# Patient Record
Sex: Female | Born: 1950
Health system: Southern US, Community
[De-identification: ages and names within clinical notes are randomized; demographics above are authoritative.]

## PROBLEM LIST (undated history)

## (undated) DIAGNOSIS — E785 Hyperlipidemia, unspecified: Secondary | ICD-10-CM

## (undated) DIAGNOSIS — Z8249 Family history of ischemic heart disease and other diseases of the circulatory system: Secondary | ICD-10-CM

## (undated) DIAGNOSIS — I10 Essential (primary) hypertension: Secondary | ICD-10-CM

## (undated) HISTORY — DX: Family history of ischemic heart disease and other diseases of the circulatory system: Z82.49

## (undated) HISTORY — DX: Hyperlipidemia, unspecified: E78.5

---

## 2008-12-06 ENCOUNTER — Ambulatory Visit (HOSPITAL_COMMUNITY): Admission: RE | Admit: 2008-12-06 | Discharge: 2008-12-06 | Payer: Self-pay | Admitting: Family Medicine

## 2009-12-08 ENCOUNTER — Ambulatory Visit (HOSPITAL_COMMUNITY): Admission: RE | Admit: 2009-12-08 | Discharge: 2009-12-08 | Payer: Self-pay | Admitting: Family Medicine

## 2010-11-06 ENCOUNTER — Other Ambulatory Visit (HOSPITAL_COMMUNITY): Payer: Self-pay | Admitting: Family Medicine

## 2010-11-06 DIAGNOSIS — Z139 Encounter for screening, unspecified: Secondary | ICD-10-CM

## 2010-12-11 ENCOUNTER — Ambulatory Visit (HOSPITAL_COMMUNITY)
Admission: RE | Admit: 2010-12-11 | Discharge: 2010-12-11 | Disposition: A | Discharge status: Home / Self Care | Payer: BC Managed Care – PPO | Source: Ambulatory Visit | Attending: Family Medicine | Admitting: Family Medicine

## 2010-12-11 DIAGNOSIS — Z139 Encounter for screening, unspecified: Secondary | ICD-10-CM

## 2010-12-11 DIAGNOSIS — Z1231 Encounter for screening mammogram for malignant neoplasm of breast: Secondary | ICD-10-CM | POA: Insufficient documentation

## 2011-01-07 ENCOUNTER — Other Ambulatory Visit (HOSPITAL_COMMUNITY): Payer: Self-pay | Admitting: Family Medicine

## 2011-01-07 DIAGNOSIS — Z139 Encounter for screening, unspecified: Secondary | ICD-10-CM

## 2011-01-07 DIAGNOSIS — Z01419 Encounter for gynecological examination (general) (routine) without abnormal findings: Secondary | ICD-10-CM

## 2011-01-11 ENCOUNTER — Ambulatory Visit (HOSPITAL_COMMUNITY)
Admission: RE | Admit: 2011-01-11 | Discharge: 2011-01-11 | Disposition: A | Discharge status: Home / Self Care | Payer: BLUE CROSS/BLUE SHIELD | Source: Ambulatory Visit | Attending: Family Medicine | Admitting: Family Medicine

## 2011-01-11 ENCOUNTER — Encounter (HOSPITAL_COMMUNITY): Payer: Self-pay

## 2011-01-11 DIAGNOSIS — Z139 Encounter for screening, unspecified: Secondary | ICD-10-CM

## 2011-01-11 DIAGNOSIS — Z01419 Encounter for gynecological examination (general) (routine) without abnormal findings: Secondary | ICD-10-CM

## 2011-01-11 DIAGNOSIS — Z13820 Encounter for screening for osteoporosis: Secondary | ICD-10-CM | POA: Insufficient documentation

## 2011-01-11 DIAGNOSIS — M949 Disorder of cartilage, unspecified: Secondary | ICD-10-CM | POA: Insufficient documentation

## 2011-01-11 DIAGNOSIS — M899 Disorder of bone, unspecified: Secondary | ICD-10-CM | POA: Insufficient documentation

## 2011-01-11 HISTORY — DX: Essential (primary) hypertension: I10

## 2011-11-19 ENCOUNTER — Other Ambulatory Visit (HOSPITAL_COMMUNITY): Payer: Self-pay | Admitting: Family Medicine

## 2011-11-19 DIAGNOSIS — Z139 Encounter for screening, unspecified: Secondary | ICD-10-CM

## 2011-12-12 ENCOUNTER — Ambulatory Visit (HOSPITAL_COMMUNITY)
Admission: RE | Admit: 2011-12-12 | Discharge: 2011-12-12 | Disposition: A | Discharge status: Home / Self Care | Payer: BC Managed Care – PPO | Source: Ambulatory Visit | Attending: Family Medicine | Admitting: Family Medicine

## 2011-12-12 DIAGNOSIS — Z139 Encounter for screening, unspecified: Secondary | ICD-10-CM

## 2011-12-12 DIAGNOSIS — Z1231 Encounter for screening mammogram for malignant neoplasm of breast: Secondary | ICD-10-CM | POA: Insufficient documentation

## 2012-11-04 ENCOUNTER — Other Ambulatory Visit (HOSPITAL_COMMUNITY): Payer: Self-pay | Admitting: Family Medicine

## 2012-11-04 ENCOUNTER — Other Ambulatory Visit: Payer: Self-pay

## 2012-11-04 DIAGNOSIS — Z139 Encounter for screening, unspecified: Secondary | ICD-10-CM

## 2012-12-28 ENCOUNTER — Ambulatory Visit (HOSPITAL_COMMUNITY): Payer: BC Managed Care – PPO

## 2013-01-14 ENCOUNTER — Ambulatory Visit: Payer: BC Managed Care – PPO | Attending: Orthopedic Surgery | Admitting: Physical Therapy

## 2013-01-14 DIAGNOSIS — IMO0001 Reserved for inherently not codable concepts without codable children: Secondary | ICD-10-CM | POA: Insufficient documentation

## 2013-01-14 DIAGNOSIS — R5381 Other malaise: Secondary | ICD-10-CM | POA: Insufficient documentation

## 2013-01-14 DIAGNOSIS — M25619 Stiffness of unspecified shoulder, not elsewhere classified: Secondary | ICD-10-CM | POA: Insufficient documentation

## 2013-01-14 DIAGNOSIS — S42309D Unspecified fracture of shaft of humerus, unspecified arm, subsequent encounter for fracture with routine healing: Secondary | ICD-10-CM | POA: Insufficient documentation

## 2013-01-14 DIAGNOSIS — M25519 Pain in unspecified shoulder: Secondary | ICD-10-CM | POA: Insufficient documentation

## 2013-01-19 ENCOUNTER — Ambulatory Visit: Payer: BC Managed Care – PPO | Attending: Orthopedic Surgery | Admitting: Physical Therapy

## 2013-01-19 DIAGNOSIS — M25619 Stiffness of unspecified shoulder, not elsewhere classified: Secondary | ICD-10-CM | POA: Insufficient documentation

## 2013-01-19 DIAGNOSIS — IMO0001 Reserved for inherently not codable concepts without codable children: Secondary | ICD-10-CM | POA: Insufficient documentation

## 2013-01-19 DIAGNOSIS — R5381 Other malaise: Secondary | ICD-10-CM | POA: Insufficient documentation

## 2013-01-19 DIAGNOSIS — M25519 Pain in unspecified shoulder: Secondary | ICD-10-CM | POA: Insufficient documentation

## 2013-01-19 DIAGNOSIS — S42309D Unspecified fracture of shaft of humerus, unspecified arm, subsequent encounter for fracture with routine healing: Secondary | ICD-10-CM | POA: Insufficient documentation

## 2013-01-21 ENCOUNTER — Ambulatory Visit: Payer: BC Managed Care – PPO | Admitting: Physical Therapy

## 2013-01-26 ENCOUNTER — Ambulatory Visit: Payer: BC Managed Care – PPO | Admitting: Physical Therapy

## 2013-01-28 ENCOUNTER — Ambulatory Visit: Payer: BC Managed Care – PPO | Admitting: Physical Therapy

## 2013-02-02 ENCOUNTER — Ambulatory Visit: Payer: BC Managed Care – PPO | Admitting: *Deleted

## 2013-02-04 ENCOUNTER — Ambulatory Visit: Payer: BC Managed Care – PPO | Admitting: Physical Therapy

## 2013-02-09 ENCOUNTER — Ambulatory Visit: Payer: BC Managed Care – PPO | Admitting: Physical Therapy

## 2013-02-11 ENCOUNTER — Ambulatory Visit: Payer: BC Managed Care – PPO | Admitting: Physical Therapy

## 2013-02-12 ENCOUNTER — Ambulatory Visit (HOSPITAL_COMMUNITY)
Admission: RE | Admit: 2013-02-12 | Discharge: 2013-02-12 | Disposition: A | Discharge status: Home / Self Care | Payer: BC Managed Care – PPO | Source: Ambulatory Visit | Attending: Family Medicine | Admitting: Family Medicine

## 2013-02-12 DIAGNOSIS — Z1231 Encounter for screening mammogram for malignant neoplasm of breast: Secondary | ICD-10-CM | POA: Insufficient documentation

## 2013-02-12 DIAGNOSIS — Z139 Encounter for screening, unspecified: Secondary | ICD-10-CM

## 2013-02-16 ENCOUNTER — Ambulatory Visit: Payer: BC Managed Care – PPO | Admitting: Physical Therapy

## 2013-02-18 ENCOUNTER — Ambulatory Visit: Payer: BC Managed Care – PPO | Attending: Orthopedic Surgery

## 2013-02-18 DIAGNOSIS — S42309D Unspecified fracture of shaft of humerus, unspecified arm, subsequent encounter for fracture with routine healing: Secondary | ICD-10-CM | POA: Insufficient documentation

## 2013-02-18 DIAGNOSIS — IMO0001 Reserved for inherently not codable concepts without codable children: Secondary | ICD-10-CM | POA: Insufficient documentation

## 2013-02-18 DIAGNOSIS — M25619 Stiffness of unspecified shoulder, not elsewhere classified: Secondary | ICD-10-CM | POA: Insufficient documentation

## 2013-02-18 DIAGNOSIS — R5381 Other malaise: Secondary | ICD-10-CM | POA: Insufficient documentation

## 2013-02-18 DIAGNOSIS — M25519 Pain in unspecified shoulder: Secondary | ICD-10-CM | POA: Insufficient documentation

## 2013-02-23 ENCOUNTER — Ambulatory Visit: Payer: BC Managed Care – PPO | Admitting: Physical Therapy

## 2013-02-25 ENCOUNTER — Ambulatory Visit: Payer: BC Managed Care – PPO | Admitting: Physical Therapy

## 2013-03-02 ENCOUNTER — Encounter: Payer: BC Managed Care – PPO | Admitting: Physical Therapy

## 2013-08-20 ENCOUNTER — Encounter: Payer: Self-pay | Admitting: Cardiovascular Disease

## 2013-08-20 ENCOUNTER — Ambulatory Visit (INDEPENDENT_AMBULATORY_CARE_PROVIDER_SITE_OTHER): Payer: BC Managed Care – PPO | Admitting: Cardiovascular Disease

## 2013-08-20 VITALS — BP 148/86 | HR 61 | Ht 67.0 in | Wt 196.3 lb

## 2013-08-20 DIAGNOSIS — E785 Hyperlipidemia, unspecified: Secondary | ICD-10-CM

## 2013-08-20 DIAGNOSIS — Z8249 Family history of ischemic heart disease and other diseases of the circulatory system: Secondary | ICD-10-CM

## 2013-08-20 DIAGNOSIS — I10 Essential (primary) hypertension: Secondary | ICD-10-CM | POA: Insufficient documentation

## 2013-08-20 NOTE — Assessment & Plan Note (Signed)
On Pravachol and WelChol followed by his private care physician

## 2013-08-20 NOTE — Assessment & Plan Note (Signed)
Well-controlled on current medications 

## 2013-08-20 NOTE — Patient Instructions (Signed)
Your physician recommends that you schedule a follow-up appointment in: 1 year  

## 2013-08-20 NOTE — Progress Notes (Signed)
08/20/2013 Olivia Marshall   1950-08-11  [id number]  Primary Physician Olivia Ribas, MD Primary Cardiologist: Olivia Gess MD Olivia Marshall   HPI:  [name] is a delightful 63 year old mildly overweight widowed Caucasian female mother of 3 children, grandmother 5 grandchildren who is the daughter of Olivia Marshall, one of my patients. She was referred by Dr. Dorthey Marshall for cardiovascular evaluation because of risk factors. Factors include family history with a mother who had CAD and is still living. She has treated hypertension and hyperlipidemia. She has never had a heart attack or stroke. She denies chest pain or shortness of breath.   Current Outpatient Prescriptions  Medication Sig Dispense Refill  . aspirin EC 81 MG tablet Take 81 mg by mouth daily.      . benazepril-hydrochlorthiazide (LOTENSIN HCT) 10-12.5 MG per tablet Take 1 tablet by mouth daily.      . calcium carbonate (OS-CAL) 600 MG TABS tablet Take 600 mg by mouth 2 (two) times daily with a meal.      . Coenzyme Q10 200 MG capsule Take 200 mg by mouth daily.      . colesevelam (WELCHOL) 625 MG tablet Take 625 mg by mouth daily.      . folic acid (FOLVITE) 400 MCG tablet Take 400 mcg by mouth daily.      . Omega-3 Fatty Acids (FISH OIL) 1200 MG CAPS Take 4 capsules by mouth daily.      . pravastatin (PRAVACHOL) 10 MG tablet Take 10 mg by mouth daily.      . Vitamin Mixture (ESTER-C PO) Take 500 mg by mouth daily.      Marland Kitchen zolpidem (AMBIEN) 10 MG tablet Take 10 mg by mouth as needed.       No current facility-administered medications for this visit.    No Known Allergies  History   Social History  . Marital Status: Widowed    Spouse Name: N/A    Number of Children: N/A  . Years of Education: N/A   Occupational History  . Not on file.   Social History Main Topics  . Smoking status: Never Smoker   . Smokeless tobacco: Never Used  . Alcohol Use: No  . Drug Use: No  . Sexual Activity: Not on  file   Other Topics Concern  . Not on file   Social History Narrative  . No narrative on file     Review of Systems: General: negative for chills, fever, night sweats or weight changes.  Cardiovascular: negative for chest pain, dyspnea on exertion, edema, orthopnea, palpitations, paroxysmal nocturnal dyspnea or shortness of breath Dermatological: negative for rash Respiratory: negative for cough or wheezing Urologic: negative for hematuria Abdominal: negative for nausea, vomiting, diarrhea, bright red blood per rectum, melena, or hematemesis Neurologic: negative for visual changes, syncope, or dizziness All other systems reviewed and are otherwise negative except as noted above.    Blood pressure 148/86, pulse 61, height 5\' 7"  (1.702 m), weight 196 lb 4.8 oz (89.041 kg).  General appearance: alert and no distress Neck: no adenopathy, no carotid bruit, no JVD, supple, symmetrical, trachea midline and thyroid not enlarged, symmetric, no tenderness/mass/nodules Lungs: clear to auscultation bilaterally Heart: regular rate and rhythm, S1, S2 normal, no murmur, click, rub or gallop Extremities: extremities normal, atraumatic, no cyanosis or edema and 2+ pedal pulses  EKG normal sinus rhythm at 61 with septal Q waves  ASSESSMENT AND PLAN:   Essential hypertension Well-controlled on current  medications  Hyperlipidemia On Pravachol and WelChol followed by his private care physician      Olivia GessJonathan J. Rosalita Carey MD [facility] LadysmithFACP,FACC,FAHA, [facility]SC[facility] 08/20/2013 8:31 AM

## 2014-01-06 ENCOUNTER — Other Ambulatory Visit (HOSPITAL_COMMUNITY): Payer: Self-pay | Admitting: Family Medicine

## 2014-01-06 DIAGNOSIS — Z1231 Encounter for screening mammogram for malignant neoplasm of breast: Secondary | ICD-10-CM

## 2014-03-02 ENCOUNTER — Ambulatory Visit (HOSPITAL_COMMUNITY)
Admission: RE | Admit: 2014-03-02 | Discharge: 2014-03-02 | Disposition: A | Discharge status: Home / Self Care | Payer: BC Managed Care – PPO | Source: Ambulatory Visit | Attending: Family Medicine | Admitting: Family Medicine

## 2014-03-02 DIAGNOSIS — Z1231 Encounter for screening mammogram for malignant neoplasm of breast: Secondary | ICD-10-CM

## 2015-01-24 ENCOUNTER — Encounter: Payer: Self-pay | Admitting: Cardiovascular Disease

## 2015-01-24 ENCOUNTER — Ambulatory Visit (INDEPENDENT_AMBULATORY_CARE_PROVIDER_SITE_OTHER): Payer: BLUE CROSS/BLUE SHIELD | Admitting: Cardiovascular Disease

## 2015-01-24 VITALS — BP 146/82 | HR 55 | Ht 66.0 in | Wt 213.0 lb

## 2015-01-24 DIAGNOSIS — I10 Essential (primary) hypertension: Secondary | ICD-10-CM

## 2015-01-24 DIAGNOSIS — E785 Hyperlipidemia, unspecified: Secondary | ICD-10-CM

## 2015-01-24 NOTE — Progress Notes (Signed)
01/24/2015 Olivia Marshall   [birth year]  [id number]  Primary Physician Olivia Ribas, MD Primary Cardiologist: Olivia Gess MD Olivia Marshall   HPI:  Olivia Marshall is a delightful 64 year old mildly overweight widowed Caucasian female mother of 3 children, grandmother 5 grandchildren who is the daughter of Olivia Marshall, one of my patients. I last saw her in the office 08/20/13. She was referred by Dr. Dorthey Marshall for cardiovascular evaluation because of risk factors. Her cardiovascular risk factors include family history with a mother who had CAD and passed away since her last visit.Marland Kitchen 2 of her brothers have coronary artery disease as well including her brother Olivia Marshall who had a large heart attack Thanksgiving 2015. She has treated hypertension and hyperlipidemia. She has never had a heart attack or stroke. She denies chest pain or shortness of breath.   Current Outpatient Prescriptions  Medication Sig Dispense Refill  . aspirin EC 81 MG tablet Take 81 mg by mouth daily.    . benazepril-hydrochlorthiazide (LOTENSIN HCT) 10-12.5 MG per tablet Take 1 tablet by mouth daily.    . calcium carbonate (OS-CAL) 600 MG TABS tablet Take 600 mg by mouth 2 (two) times daily with a meal.    . Coenzyme Q10 200 MG capsule Take 200 mg by mouth daily.    . folic acid (FOLVITE) 400 MCG tablet Take 400 mcg by mouth daily.    . Omega-3 Fatty Acids (FISH OIL) 1200 MG CAPS Take 4 capsules by mouth daily.    . pravastatin (PRAVACHOL) 10 MG tablet Take 10 mg by mouth daily.    . Vitamin Mixture (ESTER-C PO) Take 500 mg by mouth daily.     No current facility-administered medications for this visit.    No Known Allergies  Social History   Social History  . Marital Status: Widowed    Spouse Name: N/A  . Number of Children: N/A  . Years of Education: N/A   Occupational History  . Not on file.   Social History Main Topics  . Smoking status: Never Smoker   . Smokeless tobacco: Never Used  .  Alcohol Use: No  . Drug Use: No  . Sexual Activity: Not on file   Other Topics Concern  . Not on file   Social History Narrative     Review of Systems: General: negative for chills, fever, night sweats or weight changes.  Cardiovascular: negative for chest pain, dyspnea on exertion, edema, orthopnea, palpitations, paroxysmal nocturnal dyspnea or shortness of breath Dermatological: negative for rash Respiratory: negative for cough or wheezing Urologic: negative for hematuria Abdominal: negative for nausea, vomiting, diarrhea, bright red blood per rectum, melena, or hematemesis Neurologic: negative for visual changes, syncope, or dizziness All other systems reviewed and are otherwise negative except as noted above.    Blood pressure 146/82, pulse 55, height  (1.676 m), weight 213 lb (96.616 kg).  General appearance: alert and no distress Neck: no adenopathy, no carotid bruit, no JVD, supple, symmetrical, trachea midline and thyroid not enlarged, symmetric, no tenderness/mass/nodules Lungs: clear to auscultation bilaterally Heart: regular rate and rhythm, S1, S2 normal, no murmur, click, rub or gallop Extremities: extremities normal, atraumatic, no cyanosis or edema Pulses: 2+ and symmetric Skin: Skin color, texture, turgor normal. No rashes or lesions Neurologic: Grossly normal  EKG sinus bradycardia 55 without ST or T-wave changes. I personally reviewed this EKG  ASSESSMENT AND PLAN:   Hyperlipidemia Olivia Marshall has a history of hyperlipidemia on pravastatin 10  mg a day followed by her PCP.  Essential hypertension History of hypertension with blood pressure measured today at 146/82. She is on Lotensin and hydrochlorothiazide. Continue current meds at current dosing      Olivia Gess MD [facility], [facility] 01/24/2015 8:32 AM

## 2015-01-24 NOTE — Assessment & Plan Note (Signed)
Olivia Marshall has a history of hyperlipidemia on pravastatin 10 mg a day followed by her PCP.

## 2015-01-24 NOTE — Assessment & Plan Note (Signed)
History of hypertension with blood pressure measured today at 146/82. She is on Lotensin and hydrochlorothiazide. Continue current meds at current dosing

## 2015-01-24 NOTE — Patient Instructions (Signed)
Your physician wants you to follow-up in: 1 year with Dr Berry. You will receive a reminder letter in the mail two months in advance. If you don't receive a letter, please call our office to schedule the follow-up appointment.  

## 2015-02-02 ENCOUNTER — Other Ambulatory Visit (HOSPITAL_COMMUNITY): Payer: Self-pay | Admitting: Family Medicine

## 2015-02-02 DIAGNOSIS — Z1231 Encounter for screening mammogram for malignant neoplasm of breast: Secondary | ICD-10-CM

## 2015-03-16 ENCOUNTER — Ambulatory Visit (HOSPITAL_COMMUNITY)
Admission: RE | Admit: 2015-03-16 | Discharge: 2015-03-16 | Disposition: A | Discharge status: Home / Self Care | Payer: BLUE CROSS/BLUE SHIELD | Source: Ambulatory Visit | Attending: Family Medicine | Admitting: Family Medicine

## 2015-03-16 DIAGNOSIS — Z1231 Encounter for screening mammogram for malignant neoplasm of breast: Secondary | ICD-10-CM | POA: Insufficient documentation

## 2015-07-24 ENCOUNTER — Other Ambulatory Visit (HOSPITAL_COMMUNITY): Payer: Self-pay | Admitting: Family Medicine

## 2015-07-24 ENCOUNTER — Ambulatory Visit (HOSPITAL_COMMUNITY)
Admission: RE | Admit: 2015-07-24 | Discharge: 2015-07-24 | Disposition: A | Discharge status: Home / Self Care | Payer: BLUE CROSS/BLUE SHIELD | Source: Ambulatory Visit | Attending: Family Medicine | Admitting: Family Medicine

## 2015-07-24 DIAGNOSIS — E119 Type 2 diabetes mellitus without complications: Secondary | ICD-10-CM | POA: Insufficient documentation

## 2015-07-24 DIAGNOSIS — E6609 Other obesity due to excess calories: Secondary | ICD-10-CM | POA: Diagnosis present

## 2015-07-24 DIAGNOSIS — M79601 Pain in right arm: Secondary | ICD-10-CM

## 2015-07-24 DIAGNOSIS — M79621 Pain in right upper arm: Secondary | ICD-10-CM | POA: Insufficient documentation

## 2016-01-30 ENCOUNTER — Encounter: Payer: Self-pay | Admitting: Cardiovascular Disease

## 2016-01-30 ENCOUNTER — Ambulatory Visit (INDEPENDENT_AMBULATORY_CARE_PROVIDER_SITE_OTHER): Payer: PPO | Admitting: Cardiovascular Disease

## 2016-01-30 VITALS — BP 152/84 | HR 76 | Ht 67.0 in | Wt 219.1 lb

## 2016-01-30 DIAGNOSIS — E785 Hyperlipidemia, unspecified: Secondary | ICD-10-CM | POA: Diagnosis not present

## 2016-01-30 DIAGNOSIS — I10 Essential (primary) hypertension: Secondary | ICD-10-CM

## 2016-01-30 NOTE — Progress Notes (Signed)
01/30/2016 Olivia Marshall   25-Jan-1951  [id number]  Primary Physician Colette RibasGOLDING, JOHN CABOT, MD Primary Cardiologist: Runell GessJonathan J Lianette Broussard MD Roseanne RenoFACP, FACC, FAHA, FSCAI  HPI:  Ms. Olivia Marshall is a delightful 65 year old mildly overweight widowed Caucasian female mother of 3 children, grandmother 5 grandchildren who is the daughter of Olivia Marshall, one of my patients. I last saw her in the office 01/24/15. She was referred by Dr. Dorthey SawyerJay Golding for cardiovascular evaluation because of risk factors. Her cardiovascular risk factors include family history with a mother who had CAD and passed away since her last visit.Marland Kitchen. 2 of her brothers have coronary artery disease as well including her brother Olivia Marshall who had a large heart attack Thanksgiving 2015. She has treated hypertension and hyperlipidemia. She has never had a heart attack or stroke. She denies chest pain or shortness of breath.   Current Outpatient Prescriptions  Medication Sig Dispense Refill  . aspirin EC 81 MG tablet Take 81 mg by mouth daily.    . benazepril-hydrochlorthiazide (LOTENSIN HCT) 10-12.5 MG per tablet Take 1 tablet by mouth daily.    . calcium carbonate (OS-CAL) 600 MG TABS tablet Take 600 mg by mouth 2 (two) times daily with a meal.    . Coenzyme Q10 200 MG capsule Take 200 mg by mouth daily.    . folic acid (FOLVITE) 400 MCG tablet Take 400 mcg by mouth daily.    . Omega-3 Fatty Acids (FISH OIL) 1200 MG CAPS Take 4 capsules by mouth daily.    . pravastatin (PRAVACHOL) 10 MG tablet Take 10 mg by mouth daily.    . Vitamin Mixture (ESTER-C PO) Take 500 mg by mouth daily.     No current facility-administered medications for this visit.     No Known Allergies  Social History   Social History  . Marital status: Widowed    Spouse name: N/A  . Number of children: N/A  . Years of education: N/A   Occupational History  . Not on file.   Social History Main Topics  . Smoking status: Never Smoker  . Smokeless tobacco: Never Used  .  Alcohol use No  . Drug use: No  . Sexual activity: Not on file   Other Topics Concern  . Not on file   Social History Narrative  . No narrative on file     Review of Systems: General: negative for chills, fever, night sweats or weight changes.  Cardiovascular: negative for chest pain, dyspnea on exertion, edema, orthopnea, palpitations, paroxysmal nocturnal dyspnea or shortness of breath Dermatological: negative for rash Respiratory: negative for cough or wheezing Urologic: negative for hematuria Abdominal: negative for nausea, vomiting, diarrhea, bright red blood per rectum, melena, or hematemesis Neurologic: negative for visual changes, syncope, or dizziness All other systems reviewed and are otherwise negative except as noted above.    Blood pressure (!) 152/84, pulse 76, height 5\' 7"  (1.702 m), weight 219 lb 2 oz (99.4 kg).  General appearance: alert and no distress Neck: no adenopathy, no carotid bruit, no JVD, supple, symmetrical, trachea midline and thyroid not enlarged, symmetric, no tenderness/mass/nodules Lungs: clear to auscultation bilaterally Heart: regular rate and rhythm, S1, S2 normal, no murmur, click, rub or gallop Extremities: extremities normal, atraumatic, no cyanosis or edema  EKG normal sinus rhythm at 76 with left bundle-branch block which it is a new finding for her. I personally reviewed this EKG  ASSESSMENT AND PLAN:   Essential hypertension History of hypertension blood pressure measured at  152/84. She is on benazepril and hydrochlorothiazide. Continue current meds at current dosing  Hyperlipidemia History of hyperlipidemia on statin therapy followed by her PCP      Runell Gess MD [facility], [facility] 01/30/2016 10:23 AM

## 2016-01-30 NOTE — Assessment & Plan Note (Signed)
History of hypertension blood pressure measured at 152/84. She is on benazepril and hydrochlorothiazide. Continue current meds at current dosing

## 2016-01-30 NOTE — Patient Instructions (Signed)

## 2016-01-30 NOTE — Assessment & Plan Note (Signed)
History of hyperlipidemia on statin therapy followed by her PCP. 

## 2016-02-21 ENCOUNTER — Other Ambulatory Visit (HOSPITAL_COMMUNITY): Payer: Self-pay | Admitting: Family Medicine

## 2016-02-21 DIAGNOSIS — Z1231 Encounter for screening mammogram for malignant neoplasm of breast: Secondary | ICD-10-CM

## 2016-03-18 ENCOUNTER — Ambulatory Visit (HOSPITAL_COMMUNITY)
Admission: RE | Admit: 2016-03-18 | Discharge: 2016-03-18 | Disposition: A | Discharge status: Home / Self Care | Payer: PPO | Source: Ambulatory Visit | Attending: Family Medicine | Admitting: Family Medicine

## 2016-03-18 DIAGNOSIS — Z1231 Encounter for screening mammogram for malignant neoplasm of breast: Secondary | ICD-10-CM | POA: Diagnosis not present

## 2016-08-05 DIAGNOSIS — E782 Mixed hyperlipidemia: Secondary | ICD-10-CM | POA: Diagnosis not present

## 2016-08-05 DIAGNOSIS — I10 Essential (primary) hypertension: Secondary | ICD-10-CM | POA: Diagnosis not present

## 2016-08-05 DIAGNOSIS — E6609 Other obesity due to excess calories: Secondary | ICD-10-CM | POA: Diagnosis not present

## 2016-08-05 DIAGNOSIS — Z6836 Body mass index (BMI) 36.0-36.9, adult: Secondary | ICD-10-CM | POA: Diagnosis not present

## 2016-08-05 DIAGNOSIS — J069 Acute upper respiratory infection, unspecified: Secondary | ICD-10-CM | POA: Diagnosis not present

## 2016-08-05 DIAGNOSIS — R7309 Other abnormal glucose: Secondary | ICD-10-CM | POA: Diagnosis not present

## 2016-08-05 DIAGNOSIS — Z1389 Encounter for screening for other disorder: Secondary | ICD-10-CM | POA: Diagnosis not present

## 2016-11-18 ENCOUNTER — Other Ambulatory Visit (HOSPITAL_COMMUNITY): Payer: Self-pay | Admitting: Family Medicine

## 2016-11-18 DIAGNOSIS — E2839 Other primary ovarian failure: Secondary | ICD-10-CM

## 2016-11-25 DIAGNOSIS — L255 Unspecified contact dermatitis due to plants, except food: Secondary | ICD-10-CM | POA: Diagnosis not present

## 2016-12-18 ENCOUNTER — Ambulatory Visit (HOSPITAL_COMMUNITY)
Admission: RE | Admit: 2016-12-18 | Discharge: 2016-12-18 | Disposition: A | Discharge status: Home / Self Care | Payer: Medicare HMO | Source: Ambulatory Visit | Attending: Family Medicine | Admitting: Family Medicine

## 2016-12-18 DIAGNOSIS — E2839 Other primary ovarian failure: Secondary | ICD-10-CM | POA: Diagnosis not present

## 2016-12-18 DIAGNOSIS — Z13820 Encounter for screening for osteoporosis: Secondary | ICD-10-CM | POA: Diagnosis not present

## 2016-12-18 DIAGNOSIS — Z780 Asymptomatic menopausal state: Secondary | ICD-10-CM | POA: Diagnosis not present

## 2017-02-11 DIAGNOSIS — H5211 Myopia, right eye: Secondary | ICD-10-CM | POA: Diagnosis not present

## 2017-02-11 DIAGNOSIS — E782 Mixed hyperlipidemia: Secondary | ICD-10-CM | POA: Diagnosis not present

## 2017-02-11 DIAGNOSIS — I10 Essential (primary) hypertension: Secondary | ICD-10-CM | POA: Diagnosis not present

## 2017-02-11 DIAGNOSIS — Z23 Encounter for immunization: Secondary | ICD-10-CM | POA: Diagnosis not present

## 2017-02-11 DIAGNOSIS — H524 Presbyopia: Secondary | ICD-10-CM | POA: Diagnosis not present

## 2017-02-11 DIAGNOSIS — Z0001 Encounter for general adult medical examination with abnormal findings: Secondary | ICD-10-CM | POA: Diagnosis not present

## 2017-02-11 DIAGNOSIS — H5202 Hypermetropia, left eye: Secondary | ICD-10-CM | POA: Diagnosis not present

## 2017-02-11 DIAGNOSIS — Z6834 Body mass index (BMI) 34.0-34.9, adult: Secondary | ICD-10-CM | POA: Diagnosis not present

## 2017-02-11 DIAGNOSIS — H52202 Unspecified astigmatism, left eye: Secondary | ICD-10-CM | POA: Diagnosis not present

## 2017-02-11 DIAGNOSIS — Z1389 Encounter for screening for other disorder: Secondary | ICD-10-CM | POA: Diagnosis not present

## 2017-02-11 DIAGNOSIS — R7309 Other abnormal glucose: Secondary | ICD-10-CM | POA: Diagnosis not present

## 2017-02-11 DIAGNOSIS — E6609 Other obesity due to excess calories: Secondary | ICD-10-CM | POA: Diagnosis not present

## 2017-03-19 ENCOUNTER — Other Ambulatory Visit (HOSPITAL_COMMUNITY): Payer: Self-pay | Admitting: Family Medicine

## 2017-03-19 DIAGNOSIS — Z1231 Encounter for screening mammogram for malignant neoplasm of breast: Secondary | ICD-10-CM

## 2017-03-27 ENCOUNTER — Ambulatory Visit (HOSPITAL_COMMUNITY)
Admission: RE | Admit: 2017-03-27 | Discharge: 2017-03-27 | Disposition: A | Discharge status: Home / Self Care | Payer: Medicare HMO | Source: Ambulatory Visit | Attending: Family Medicine | Admitting: Family Medicine

## 2017-03-27 DIAGNOSIS — Z1231 Encounter for screening mammogram for malignant neoplasm of breast: Secondary | ICD-10-CM | POA: Insufficient documentation

## 2017-06-03 DIAGNOSIS — E6609 Other obesity due to excess calories: Secondary | ICD-10-CM | POA: Diagnosis not present

## 2017-06-03 DIAGNOSIS — Z1389 Encounter for screening for other disorder: Secondary | ICD-10-CM | POA: Diagnosis not present

## 2017-06-03 DIAGNOSIS — R8290 Unspecified abnormal findings in urine: Secondary | ICD-10-CM | POA: Diagnosis not present

## 2017-06-03 DIAGNOSIS — I10 Essential (primary) hypertension: Secondary | ICD-10-CM | POA: Diagnosis not present

## 2017-06-03 DIAGNOSIS — R946 Abnormal results of thyroid function studies: Secondary | ICD-10-CM | POA: Diagnosis not present

## 2017-06-03 DIAGNOSIS — R7309 Other abnormal glucose: Secondary | ICD-10-CM | POA: Diagnosis not present

## 2017-06-03 DIAGNOSIS — Z6834 Body mass index (BMI) 34.0-34.9, adult: Secondary | ICD-10-CM | POA: Diagnosis not present

## 2017-06-03 DIAGNOSIS — E782 Mixed hyperlipidemia: Secondary | ICD-10-CM | POA: Diagnosis not present

## 2017-10-20 DIAGNOSIS — J069 Acute upper respiratory infection, unspecified: Secondary | ICD-10-CM | POA: Diagnosis not present

## 2017-10-20 DIAGNOSIS — J019 Acute sinusitis, unspecified: Secondary | ICD-10-CM | POA: Diagnosis not present

## 2017-12-10 DIAGNOSIS — Z6835 Body mass index (BMI) 35.0-35.9, adult: Secondary | ICD-10-CM | POA: Diagnosis not present

## 2017-12-10 DIAGNOSIS — I10 Essential (primary) hypertension: Secondary | ICD-10-CM | POA: Diagnosis not present

## 2017-12-10 DIAGNOSIS — G4730 Sleep apnea, unspecified: Secondary | ICD-10-CM | POA: Diagnosis not present

## 2017-12-10 DIAGNOSIS — Z1389 Encounter for screening for other disorder: Secondary | ICD-10-CM | POA: Diagnosis not present

## 2017-12-10 DIAGNOSIS — E782 Mixed hyperlipidemia: Secondary | ICD-10-CM | POA: Diagnosis not present

## 2017-12-10 DIAGNOSIS — R946 Abnormal results of thyroid function studies: Secondary | ICD-10-CM | POA: Diagnosis not present

## 2017-12-10 DIAGNOSIS — R7309 Other abnormal glucose: Secondary | ICD-10-CM | POA: Diagnosis not present

## 2017-12-16 DIAGNOSIS — G4700 Insomnia, unspecified: Secondary | ICD-10-CM | POA: Diagnosis not present

## 2017-12-16 DIAGNOSIS — Z7722 Contact with and (suspected) exposure to environmental tobacco smoke (acute) (chronic): Secondary | ICD-10-CM | POA: Diagnosis not present

## 2017-12-16 DIAGNOSIS — K5900 Constipation, unspecified: Secondary | ICD-10-CM | POA: Diagnosis not present

## 2017-12-16 DIAGNOSIS — K08409 Partial loss of teeth, unspecified cause, unspecified class: Secondary | ICD-10-CM | POA: Diagnosis not present

## 2017-12-16 DIAGNOSIS — Z809 Family history of malignant neoplasm, unspecified: Secondary | ICD-10-CM | POA: Diagnosis not present

## 2017-12-16 DIAGNOSIS — Z800 Family history of malignant neoplasm of digestive organs: Secondary | ICD-10-CM | POA: Diagnosis not present

## 2017-12-16 DIAGNOSIS — Z820 Family history of epilepsy and other diseases of the nervous system: Secondary | ICD-10-CM | POA: Diagnosis not present

## 2017-12-16 DIAGNOSIS — E785 Hyperlipidemia, unspecified: Secondary | ICD-10-CM | POA: Diagnosis not present

## 2017-12-16 DIAGNOSIS — I10 Essential (primary) hypertension: Secondary | ICD-10-CM | POA: Diagnosis not present

## 2017-12-16 DIAGNOSIS — R32 Unspecified urinary incontinence: Secondary | ICD-10-CM | POA: Diagnosis not present

## 2018-03-09 ENCOUNTER — Other Ambulatory Visit (HOSPITAL_COMMUNITY): Payer: Self-pay | Admitting: Family Medicine

## 2018-03-09 DIAGNOSIS — Z1231 Encounter for screening mammogram for malignant neoplasm of breast: Secondary | ICD-10-CM

## 2018-03-30 ENCOUNTER — Encounter (HOSPITAL_COMMUNITY): Payer: Self-pay

## 2018-03-30 ENCOUNTER — Ambulatory Visit (HOSPITAL_COMMUNITY)
Admission: RE | Admit: 2018-03-30 | Discharge: 2018-03-30 | Disposition: A | Discharge status: Home / Self Care | Payer: Medicare HMO | Source: Ambulatory Visit | Attending: Family Medicine | Admitting: Family Medicine

## 2018-03-30 DIAGNOSIS — Z1231 Encounter for screening mammogram for malignant neoplasm of breast: Secondary | ICD-10-CM | POA: Insufficient documentation

## 2018-04-07 DIAGNOSIS — M7661 Achilles tendinitis, right leg: Secondary | ICD-10-CM | POA: Diagnosis not present

## 2018-04-07 DIAGNOSIS — R350 Frequency of micturition: Secondary | ICD-10-CM | POA: Diagnosis not present

## 2018-04-07 DIAGNOSIS — Z1389 Encounter for screening for other disorder: Secondary | ICD-10-CM | POA: Diagnosis not present

## 2018-04-07 DIAGNOSIS — Z6836 Body mass index (BMI) 36.0-36.9, adult: Secondary | ICD-10-CM | POA: Diagnosis not present

## 2018-04-07 DIAGNOSIS — Z23 Encounter for immunization: Secondary | ICD-10-CM | POA: Diagnosis not present

## 2018-04-07 DIAGNOSIS — E6609 Other obesity due to excess calories: Secondary | ICD-10-CM | POA: Diagnosis not present

## 2018-04-09 DIAGNOSIS — H52202 Unspecified astigmatism, left eye: Secondary | ICD-10-CM | POA: Diagnosis not present

## 2018-04-09 DIAGNOSIS — H5211 Myopia, right eye: Secondary | ICD-10-CM | POA: Diagnosis not present

## 2018-04-09 DIAGNOSIS — H524 Presbyopia: Secondary | ICD-10-CM | POA: Diagnosis not present

## 2018-04-09 DIAGNOSIS — H2513 Age-related nuclear cataract, bilateral: Secondary | ICD-10-CM | POA: Diagnosis not present

## 2018-04-09 DIAGNOSIS — H25013 Cortical age-related cataract, bilateral: Secondary | ICD-10-CM | POA: Diagnosis not present

## 2018-04-21 ENCOUNTER — Ambulatory Visit (INDEPENDENT_AMBULATORY_CARE_PROVIDER_SITE_OTHER): Payer: Medicare HMO | Admitting: Orthopedic Surgery

## 2018-04-21 ENCOUNTER — Ambulatory Visit (INDEPENDENT_AMBULATORY_CARE_PROVIDER_SITE_OTHER): Payer: Medicare HMO

## 2018-04-21 ENCOUNTER — Encounter (INDEPENDENT_AMBULATORY_CARE_PROVIDER_SITE_OTHER): Payer: Self-pay | Admitting: Orthopedic Surgery

## 2018-04-21 VITALS — Ht 67.0 in | Wt 219.0 lb

## 2018-04-21 DIAGNOSIS — M9261 Juvenile osteochondrosis of tarsus, right ankle: Secondary | ICD-10-CM

## 2018-04-21 DIAGNOSIS — M79671 Pain in right foot: Secondary | ICD-10-CM

## 2018-04-21 NOTE — Progress Notes (Signed)
Office Visit Note   Patient: Olivia Marshall           Date of Birth: 1951-04-06           MRN: [id number] Visit Date: 04/21/2018              Requested by: Assunta FoundGolding, John, MD [address] [place], [place] [id number] PCP: Assunta FoundGolding, John, MD  Chief Complaint  Patient presents with  . Right Foot - Pain      HPI: Patient is a 67 year old woman who presents with painful Haglund's deformity on the right.  Patient is status post Haglund's resection on the left years ago she did sustain trauma where she dropped a metal pipe against her surgical incision and sustained a Achilles tendon rupture.  Patient has been asymptomatic on the left only has symptoms on the right.  Assessment & Plan: Visit Diagnoses:  1. Right foot pain   2. Haglund's deformity of right heel     Plan: Wound recommended proceeding with Haglund's resection endoscopically risks and benefits were discussed including risk of the Achilles rupture risk of infection.  Patient states she understands wished to proceed as outpatient surgery after the first of the year.  We will need to place her in a fracture boot with a heel lift postoperatively.  Follow-Up Instructions: Return in about 1 week (around 04/28/2018).   Ortho Exam  Patient is alert, oriented, no adenopathy, well-dressed, normal affect, normal respiratory effort. Examination patient has good pulses she has dorsiflexion past neutral she has a very large Haglund's deformity posteriorly no skin breakdown or lesions.  She is tender to palpation over the Haglund's deformity has good active Achilles tendon function with no nodular changes.  Imaging: Xr Foot 2 Views Right  Result Date: 04/21/2018 Lateral radiograph of the right hindfoot shows a massive Haglund's deformity with enthesopathy and calcification of the Achilles insertion.  No images are attached to the encounter.  Labs: No results found for: HGBA1C, ESRSEDRATE, CRP, LABURIC, REPTSTATUS, GRAMSTAIN,  CULT, LABORGA   No results found for: ALBUMIN, PREALBUMIN, LABURIC  Body mass index is 34.3 kg/m.  Orders:  Orders Placed This Encounter  Procedures  . XR Foot 2 Views Right   No orders of the defined types were placed in this encounter.    Procedures: No procedures performed  Clinical Data: No additional findings.  ROS:  All other systems negative, except as noted in the HPI. Review of Systems  Objective: Vital Signs: Ht 5\' 7"  (1.702 m)   Wt 219 lb (99.3 kg)   BMI 34.30 kg/m   Specialty Comments:  No specialty comments available.  PMFS History: Patient Active Problem List   Diagnosis Date Noted  . Essential hypertension 08/20/2013  . Hyperlipidemia 08/20/2013  . Family history of coronary artery disease 08/20/2013   Past Medical History:  Diagnosis Date  . Family history of heart disease   . Hyperlipidemia   . Hypertension     Family History  Problem Relation Age of Onset  . Heart disease Mother   . Transient ischemic attack Mother   . Hyperlipidemia Mother   . Hypertension Mother   . ALS Father   . Cancer Brother        Colon cancer  . Hypertension Brother   . Cancer Maternal Grandmother        Colon cancer  . Hyperlipidemia Maternal Grandfather   . Tuberculosis Paternal Grandmother   . Stroke Paternal Grandfather   . Heart disease Paternal Grandfather  History reviewed. No pertinent surgical history. Social History   Occupational History  . Not on file  Tobacco Use  . Smoking status: Never Smoker  . Smokeless tobacco: Never Used  Substance and Sexual Activity  . Alcohol use: No  . Drug use: No  . Sexual activity: Not on file

## 2018-05-07 DIAGNOSIS — G9389 Other specified disorders of brain: Secondary | ICD-10-CM | POA: Diagnosis not present

## 2018-05-07 DIAGNOSIS — S80211A Abrasion, right knee, initial encounter: Secondary | ICD-10-CM | POA: Diagnosis not present

## 2018-05-07 DIAGNOSIS — S12201A Unspecified nondisplaced fracture of third cervical vertebra, initial encounter for closed fracture: Secondary | ICD-10-CM | POA: Diagnosis not present

## 2018-05-07 DIAGNOSIS — S02122A Fracture of orbital roof, left side, initial encounter for closed fracture: Secondary | ICD-10-CM | POA: Diagnosis not present

## 2018-05-07 DIAGNOSIS — S2241XA Multiple fractures of ribs, right side, initial encounter for closed fracture: Secondary | ICD-10-CM | POA: Diagnosis not present

## 2018-05-07 DIAGNOSIS — S02839A Fracture of medial orbital wall, unspecified side, initial encounter for closed fracture: Secondary | ICD-10-CM | POA: Diagnosis not present

## 2018-05-07 DIAGNOSIS — S065X9A Traumatic subdural hemorrhage with loss of consciousness of unspecified duration, initial encounter: Secondary | ICD-10-CM | POA: Diagnosis not present

## 2018-05-07 DIAGNOSIS — H538 Other visual disturbances: Secondary | ICD-10-CM | POA: Diagnosis not present

## 2018-05-07 DIAGNOSIS — S065X0A Traumatic subdural hemorrhage without loss of consciousness, initial encounter: Secondary | ICD-10-CM | POA: Diagnosis not present

## 2018-05-07 DIAGNOSIS — M25531 Pain in right wrist: Secondary | ICD-10-CM | POA: Diagnosis not present

## 2018-05-07 DIAGNOSIS — S066X9A Traumatic subarachnoid hemorrhage with loss of consciousness of unspecified duration, initial encounter: Secondary | ICD-10-CM | POA: Diagnosis not present

## 2018-05-07 DIAGNOSIS — S066X0A Traumatic subarachnoid hemorrhage without loss of consciousness, initial encounter: Secondary | ICD-10-CM | POA: Diagnosis not present

## 2018-05-07 DIAGNOSIS — R0989 Other specified symptoms and signs involving the circulatory and respiratory systems: Secondary | ICD-10-CM | POA: Diagnosis not present

## 2018-05-07 DIAGNOSIS — S01111A Laceration without foreign body of right eyelid and periocular area, initial encounter: Secondary | ICD-10-CM | POA: Diagnosis not present

## 2018-05-07 DIAGNOSIS — R52 Pain, unspecified: Secondary | ICD-10-CM | POA: Diagnosis not present

## 2018-05-07 DIAGNOSIS — S12290A Other displaced fracture of third cervical vertebra, initial encounter for closed fracture: Secondary | ICD-10-CM | POA: Diagnosis not present

## 2018-05-07 DIAGNOSIS — S12390A Other displaced fracture of fourth cervical vertebra, initial encounter for closed fracture: Secondary | ICD-10-CM | POA: Diagnosis not present

## 2018-05-07 DIAGNOSIS — I6502 Occlusion and stenosis of left vertebral artery: Secondary | ICD-10-CM | POA: Diagnosis not present

## 2018-05-07 DIAGNOSIS — S0240EA Zygomatic fracture, right side, initial encounter for closed fracture: Secondary | ICD-10-CM | POA: Diagnosis not present

## 2018-05-07 DIAGNOSIS — S12200A Unspecified displaced fracture of third cervical vertebra, initial encounter for closed fracture: Secondary | ICD-10-CM | POA: Diagnosis not present

## 2018-05-07 DIAGNOSIS — S0231XA Fracture of orbital floor, right side, initial encounter for closed fracture: Secondary | ICD-10-CM | POA: Diagnosis not present

## 2018-05-07 DIAGNOSIS — R58 Hemorrhage, not elsewhere classified: Secondary | ICD-10-CM | POA: Diagnosis not present

## 2018-05-07 DIAGNOSIS — S0219XA Other fracture of base of skull, initial encounter for closed fracture: Secondary | ICD-10-CM | POA: Diagnosis not present

## 2018-05-07 DIAGNOSIS — S02129A Fracture of orbital roof, unspecified side, initial encounter for closed fracture: Secondary | ICD-10-CM | POA: Diagnosis not present

## 2018-05-07 DIAGNOSIS — S0240CA Maxillary fracture, right side, initial encounter for closed fracture: Secondary | ICD-10-CM | POA: Diagnosis not present

## 2018-05-07 DIAGNOSIS — E785 Hyperlipidemia, unspecified: Secondary | ICD-10-CM | POA: Diagnosis not present

## 2018-05-07 DIAGNOSIS — Z041 Encounter for examination and observation following transport accident: Secondary | ICD-10-CM | POA: Diagnosis not present

## 2018-05-07 DIAGNOSIS — S022XXA Fracture of nasal bones, initial encounter for closed fracture: Secondary | ICD-10-CM | POA: Diagnosis not present

## 2018-05-07 DIAGNOSIS — Y998 Other external cause status: Secondary | ICD-10-CM | POA: Diagnosis not present

## 2018-05-07 DIAGNOSIS — H269 Unspecified cataract: Secondary | ICD-10-CM | POA: Diagnosis not present

## 2018-05-07 DIAGNOSIS — S02401A Maxillary fracture, unspecified, initial encounter for closed fracture: Secondary | ICD-10-CM | POA: Diagnosis not present

## 2018-05-07 DIAGNOSIS — S15102A Unspecified injury of left vertebral artery, initial encounter: Secondary | ICD-10-CM | POA: Diagnosis not present

## 2018-05-07 DIAGNOSIS — I7774 Dissection of vertebral artery: Secondary | ICD-10-CM | POA: Diagnosis not present

## 2018-05-07 DIAGNOSIS — R410 Disorientation, unspecified: Secondary | ICD-10-CM | POA: Diagnosis not present

## 2018-05-07 DIAGNOSIS — S02121A Fracture of orbital roof, right side, initial encounter for closed fracture: Secondary | ICD-10-CM | POA: Diagnosis not present

## 2018-05-07 DIAGNOSIS — Y92410 Unspecified street and highway as the place of occurrence of the external cause: Secondary | ICD-10-CM | POA: Diagnosis not present

## 2018-05-07 DIAGNOSIS — S0101XA Laceration without foreign body of scalp, initial encounter: Secondary | ICD-10-CM | POA: Diagnosis not present

## 2018-05-07 DIAGNOSIS — S0181XA Laceration without foreign body of other part of head, initial encounter: Secondary | ICD-10-CM | POA: Diagnosis not present

## 2018-05-07 DIAGNOSIS — I10 Essential (primary) hypertension: Secondary | ICD-10-CM | POA: Diagnosis not present

## 2018-05-07 DIAGNOSIS — R0902 Hypoxemia: Secondary | ICD-10-CM | POA: Diagnosis not present

## 2018-05-07 DIAGNOSIS — S06369A Traumatic hemorrhage of cerebrum, unspecified, with loss of consciousness of unspecified duration, initial encounter: Secondary | ICD-10-CM | POA: Diagnosis not present

## 2018-05-07 DIAGNOSIS — S0285XA Fracture of orbit, unspecified, initial encounter for closed fracture: Secondary | ICD-10-CM | POA: Diagnosis not present

## 2018-05-07 DIAGNOSIS — M795 Residual foreign body in soft tissue: Secondary | ICD-10-CM | POA: Diagnosis not present

## 2018-05-07 DIAGNOSIS — Z7982 Long term (current) use of aspirin: Secondary | ICD-10-CM | POA: Diagnosis not present

## 2018-05-08 DIAGNOSIS — S2241XA Multiple fractures of ribs, right side, initial encounter for closed fracture: Secondary | ICD-10-CM | POA: Diagnosis not present

## 2018-05-08 DIAGNOSIS — S12200A Unspecified displaced fracture of third cervical vertebra, initial encounter for closed fracture: Secondary | ICD-10-CM | POA: Diagnosis not present

## 2018-05-08 DIAGNOSIS — S0285XA Fracture of orbit, unspecified, initial encounter for closed fracture: Secondary | ICD-10-CM | POA: Diagnosis not present

## 2018-05-08 DIAGNOSIS — S022XXA Fracture of nasal bones, initial encounter for closed fracture: Secondary | ICD-10-CM | POA: Diagnosis not present

## 2018-05-08 DIAGNOSIS — I7774 Dissection of vertebral artery: Secondary | ICD-10-CM | POA: Diagnosis not present

## 2018-05-08 DIAGNOSIS — S06369A Traumatic hemorrhage of cerebrum, unspecified, with loss of consciousness of unspecified duration, initial encounter: Secondary | ICD-10-CM | POA: Diagnosis not present

## 2018-05-08 DIAGNOSIS — S0219XA Other fracture of base of skull, initial encounter for closed fracture: Secondary | ICD-10-CM | POA: Diagnosis not present

## 2018-05-08 DIAGNOSIS — G9389 Other specified disorders of brain: Secondary | ICD-10-CM | POA: Diagnosis not present

## 2018-05-08 DIAGNOSIS — S066X9A Traumatic subarachnoid hemorrhage with loss of consciousness of unspecified duration, initial encounter: Secondary | ICD-10-CM | POA: Diagnosis not present

## 2018-05-08 DIAGNOSIS — S02401A Maxillary fracture, unspecified, initial encounter for closed fracture: Secondary | ICD-10-CM | POA: Diagnosis not present

## 2018-05-08 DIAGNOSIS — S0240EA Zygomatic fracture, right side, initial encounter for closed fracture: Secondary | ICD-10-CM | POA: Diagnosis not present

## 2018-05-08 DIAGNOSIS — S065X9A Traumatic subdural hemorrhage with loss of consciousness of unspecified duration, initial encounter: Secondary | ICD-10-CM | POA: Diagnosis not present

## 2018-05-09 DIAGNOSIS — S0101XA Laceration without foreign body of scalp, initial encounter: Secondary | ICD-10-CM | POA: Diagnosis not present

## 2018-05-09 DIAGNOSIS — S0285XA Fracture of orbit, unspecified, initial encounter for closed fracture: Secondary | ICD-10-CM | POA: Diagnosis not present

## 2018-05-09 DIAGNOSIS — I10 Essential (primary) hypertension: Secondary | ICD-10-CM | POA: Diagnosis not present

## 2018-05-09 DIAGNOSIS — S12200A Unspecified displaced fracture of third cervical vertebra, initial encounter for closed fracture: Secondary | ICD-10-CM | POA: Diagnosis not present

## 2018-05-09 DIAGNOSIS — S066X9A Traumatic subarachnoid hemorrhage with loss of consciousness of unspecified duration, initial encounter: Secondary | ICD-10-CM | POA: Diagnosis not present

## 2018-05-09 DIAGNOSIS — S0219XA Other fracture of base of skull, initial encounter for closed fracture: Secondary | ICD-10-CM | POA: Diagnosis not present

## 2018-05-09 DIAGNOSIS — I7774 Dissection of vertebral artery: Secondary | ICD-10-CM | POA: Diagnosis not present

## 2018-05-09 DIAGNOSIS — Z7982 Long term (current) use of aspirin: Secondary | ICD-10-CM | POA: Diagnosis not present

## 2018-05-09 DIAGNOSIS — I6502 Occlusion and stenosis of left vertebral artery: Secondary | ICD-10-CM | POA: Diagnosis not present

## 2018-05-09 DIAGNOSIS — S065X9A Traumatic subdural hemorrhage with loss of consciousness of unspecified duration, initial encounter: Secondary | ICD-10-CM | POA: Diagnosis not present

## 2018-05-09 DIAGNOSIS — E785 Hyperlipidemia, unspecified: Secondary | ICD-10-CM | POA: Diagnosis not present

## 2018-05-09 DIAGNOSIS — S2241XA Multiple fractures of ribs, right side, initial encounter for closed fracture: Secondary | ICD-10-CM | POA: Diagnosis not present

## 2018-05-09 DIAGNOSIS — S12290A Other displaced fracture of third cervical vertebra, initial encounter for closed fracture: Secondary | ICD-10-CM | POA: Diagnosis not present

## 2018-05-10 DIAGNOSIS — I7774 Dissection of vertebral artery: Secondary | ICD-10-CM | POA: Diagnosis not present

## 2018-05-10 DIAGNOSIS — Z7982 Long term (current) use of aspirin: Secondary | ICD-10-CM | POA: Diagnosis not present

## 2018-05-10 DIAGNOSIS — S0240CA Maxillary fracture, right side, initial encounter for closed fracture: Secondary | ICD-10-CM | POA: Diagnosis not present

## 2018-05-10 DIAGNOSIS — S12200A Unspecified displaced fracture of third cervical vertebra, initial encounter for closed fracture: Secondary | ICD-10-CM | POA: Diagnosis not present

## 2018-05-10 DIAGNOSIS — I6502 Occlusion and stenosis of left vertebral artery: Secondary | ICD-10-CM | POA: Diagnosis not present

## 2018-05-10 DIAGNOSIS — S02839A Fracture of medial orbital wall, unspecified side, initial encounter for closed fracture: Secondary | ICD-10-CM | POA: Diagnosis not present

## 2018-05-10 DIAGNOSIS — S022XXA Fracture of nasal bones, initial encounter for closed fracture: Secondary | ICD-10-CM | POA: Diagnosis not present

## 2018-05-10 DIAGNOSIS — S02121A Fracture of orbital roof, right side, initial encounter for closed fracture: Secondary | ICD-10-CM | POA: Diagnosis not present

## 2018-05-10 DIAGNOSIS — S0101XA Laceration without foreign body of scalp, initial encounter: Secondary | ICD-10-CM | POA: Diagnosis not present

## 2018-05-10 DIAGNOSIS — S065X9A Traumatic subdural hemorrhage with loss of consciousness of unspecified duration, initial encounter: Secondary | ICD-10-CM | POA: Diagnosis not present

## 2018-05-10 DIAGNOSIS — S065X0A Traumatic subdural hemorrhage without loss of consciousness, initial encounter: Secondary | ICD-10-CM | POA: Diagnosis not present

## 2018-05-10 DIAGNOSIS — S0231XA Fracture of orbital floor, right side, initial encounter for closed fracture: Secondary | ICD-10-CM | POA: Diagnosis not present

## 2018-05-10 DIAGNOSIS — S066X0A Traumatic subarachnoid hemorrhage without loss of consciousness, initial encounter: Secondary | ICD-10-CM | POA: Diagnosis not present

## 2018-05-10 DIAGNOSIS — S0240EA Zygomatic fracture, right side, initial encounter for closed fracture: Secondary | ICD-10-CM | POA: Diagnosis not present

## 2018-05-11 DIAGNOSIS — H538 Other visual disturbances: Secondary | ICD-10-CM | POA: Diagnosis not present

## 2018-05-11 DIAGNOSIS — S02129A Fracture of orbital roof, unspecified side, initial encounter for closed fracture: Secondary | ICD-10-CM | POA: Diagnosis not present

## 2018-05-11 DIAGNOSIS — S02401A Maxillary fracture, unspecified, initial encounter for closed fracture: Secondary | ICD-10-CM | POA: Diagnosis not present

## 2018-05-11 DIAGNOSIS — I7774 Dissection of vertebral artery: Secondary | ICD-10-CM | POA: Diagnosis not present

## 2018-05-11 DIAGNOSIS — S0101XA Laceration without foreign body of scalp, initial encounter: Secondary | ICD-10-CM | POA: Diagnosis not present

## 2018-05-11 DIAGNOSIS — Z7982 Long term (current) use of aspirin: Secondary | ICD-10-CM | POA: Diagnosis not present

## 2018-05-11 DIAGNOSIS — S066X9A Traumatic subarachnoid hemorrhage with loss of consciousness of unspecified duration, initial encounter: Secondary | ICD-10-CM | POA: Diagnosis not present

## 2018-05-11 DIAGNOSIS — S2241XA Multiple fractures of ribs, right side, initial encounter for closed fracture: Secondary | ICD-10-CM | POA: Diagnosis not present

## 2018-05-11 DIAGNOSIS — S0285XA Fracture of orbit, unspecified, initial encounter for closed fracture: Secondary | ICD-10-CM | POA: Diagnosis not present

## 2018-05-11 DIAGNOSIS — S15102A Unspecified injury of left vertebral artery, initial encounter: Secondary | ICD-10-CM | POA: Diagnosis not present

## 2018-05-11 DIAGNOSIS — I10 Essential (primary) hypertension: Secondary | ICD-10-CM | POA: Diagnosis not present

## 2018-05-11 DIAGNOSIS — S022XXA Fracture of nasal bones, initial encounter for closed fracture: Secondary | ICD-10-CM | POA: Diagnosis not present

## 2018-05-11 DIAGNOSIS — S12200A Unspecified displaced fracture of third cervical vertebra, initial encounter for closed fracture: Secondary | ICD-10-CM | POA: Diagnosis not present

## 2018-05-11 DIAGNOSIS — I6502 Occlusion and stenosis of left vertebral artery: Secondary | ICD-10-CM | POA: Diagnosis not present

## 2018-05-11 DIAGNOSIS — S065X9A Traumatic subdural hemorrhage with loss of consciousness of unspecified duration, initial encounter: Secondary | ICD-10-CM | POA: Diagnosis not present

## 2018-05-11 DIAGNOSIS — S0240EA Zygomatic fracture, right side, initial encounter for closed fracture: Secondary | ICD-10-CM | POA: Diagnosis not present

## 2018-05-11 DIAGNOSIS — S0181XA Laceration without foreign body of other part of head, initial encounter: Secondary | ICD-10-CM | POA: Diagnosis not present

## 2018-05-11 DIAGNOSIS — E785 Hyperlipidemia, unspecified: Secondary | ICD-10-CM | POA: Diagnosis not present

## 2018-05-12 DIAGNOSIS — S065X9A Traumatic subdural hemorrhage with loss of consciousness of unspecified duration, initial encounter: Secondary | ICD-10-CM | POA: Diagnosis not present

## 2018-05-12 DIAGNOSIS — I10 Essential (primary) hypertension: Secondary | ICD-10-CM | POA: Diagnosis not present

## 2018-05-12 DIAGNOSIS — S2241XA Multiple fractures of ribs, right side, initial encounter for closed fracture: Secondary | ICD-10-CM | POA: Diagnosis not present

## 2018-05-12 DIAGNOSIS — S0285XA Fracture of orbit, unspecified, initial encounter for closed fracture: Secondary | ICD-10-CM | POA: Diagnosis not present

## 2018-05-12 DIAGNOSIS — S0101XA Laceration without foreign body of scalp, initial encounter: Secondary | ICD-10-CM | POA: Diagnosis not present

## 2018-05-12 DIAGNOSIS — M25531 Pain in right wrist: Secondary | ICD-10-CM | POA: Diagnosis not present

## 2018-05-12 DIAGNOSIS — S02129A Fracture of orbital roof, unspecified side, initial encounter for closed fracture: Secondary | ICD-10-CM | POA: Diagnosis not present

## 2018-05-12 DIAGNOSIS — S066X9A Traumatic subarachnoid hemorrhage with loss of consciousness of unspecified duration, initial encounter: Secondary | ICD-10-CM | POA: Diagnosis not present

## 2018-05-12 DIAGNOSIS — E785 Hyperlipidemia, unspecified: Secondary | ICD-10-CM | POA: Diagnosis not present

## 2018-05-12 DIAGNOSIS — S15102A Unspecified injury of left vertebral artery, initial encounter: Secondary | ICD-10-CM | POA: Diagnosis not present

## 2018-05-13 DIAGNOSIS — S0219XA Other fracture of base of skull, initial encounter for closed fracture: Secondary | ICD-10-CM | POA: Diagnosis not present

## 2018-05-13 DIAGNOSIS — S0101XA Laceration without foreign body of scalp, initial encounter: Secondary | ICD-10-CM | POA: Diagnosis not present

## 2018-05-13 DIAGNOSIS — S02401A Maxillary fracture, unspecified, initial encounter for closed fracture: Secondary | ICD-10-CM | POA: Diagnosis not present

## 2018-05-13 DIAGNOSIS — S065X9A Traumatic subdural hemorrhage with loss of consciousness of unspecified duration, initial encounter: Secondary | ICD-10-CM | POA: Diagnosis not present

## 2018-05-13 DIAGNOSIS — S12290A Other displaced fracture of third cervical vertebra, initial encounter for closed fracture: Secondary | ICD-10-CM | POA: Diagnosis not present

## 2018-05-13 DIAGNOSIS — S0285XA Fracture of orbit, unspecified, initial encounter for closed fracture: Secondary | ICD-10-CM | POA: Diagnosis not present

## 2018-05-13 DIAGNOSIS — S12200A Unspecified displaced fracture of third cervical vertebra, initial encounter for closed fracture: Secondary | ICD-10-CM | POA: Diagnosis not present

## 2018-05-13 DIAGNOSIS — S12390A Other displaced fracture of fourth cervical vertebra, initial encounter for closed fracture: Secondary | ICD-10-CM | POA: Diagnosis not present

## 2018-05-13 DIAGNOSIS — S0240EA Zygomatic fracture, right side, initial encounter for closed fracture: Secondary | ICD-10-CM | POA: Diagnosis not present

## 2018-05-13 DIAGNOSIS — S01111A Laceration without foreign body of right eyelid and periocular area, initial encounter: Secondary | ICD-10-CM | POA: Diagnosis not present

## 2018-05-13 DIAGNOSIS — S022XXA Fracture of nasal bones, initial encounter for closed fracture: Secondary | ICD-10-CM | POA: Diagnosis not present

## 2018-05-13 DIAGNOSIS — S066X9A Traumatic subarachnoid hemorrhage with loss of consciousness of unspecified duration, initial encounter: Secondary | ICD-10-CM | POA: Diagnosis not present

## 2018-05-15 DIAGNOSIS — S02401D Maxillary fracture, unspecified, subsequent encounter for fracture with routine healing: Secondary | ICD-10-CM | POA: Diagnosis not present

## 2018-05-15 DIAGNOSIS — S066X0D Traumatic subarachnoid hemorrhage without loss of consciousness, subsequent encounter: Secondary | ICD-10-CM | POA: Diagnosis not present

## 2018-05-15 DIAGNOSIS — S12201D Unspecified nondisplaced fracture of third cervical vertebra, subsequent encounter for fracture with routine healing: Secondary | ICD-10-CM | POA: Diagnosis not present

## 2018-05-15 DIAGNOSIS — S0231XD Fracture of orbital floor, right side, subsequent encounter for fracture with routine healing: Secondary | ICD-10-CM | POA: Diagnosis not present

## 2018-05-15 DIAGNOSIS — I10 Essential (primary) hypertension: Secondary | ICD-10-CM | POA: Diagnosis not present

## 2018-05-15 DIAGNOSIS — S0240ED Zygomatic fracture, right side, subsequent encounter for fracture with routine healing: Secondary | ICD-10-CM | POA: Diagnosis not present

## 2018-05-15 DIAGNOSIS — S022XXD Fracture of nasal bones, subsequent encounter for fracture with routine healing: Secondary | ICD-10-CM | POA: Diagnosis not present

## 2018-05-15 DIAGNOSIS — S2241XD Multiple fractures of ribs, right side, subsequent encounter for fracture with routine healing: Secondary | ICD-10-CM | POA: Diagnosis not present

## 2018-05-15 DIAGNOSIS — S065X0D Traumatic subdural hemorrhage without loss of consciousness, subsequent encounter: Secondary | ICD-10-CM | POA: Diagnosis not present

## 2018-05-15 DIAGNOSIS — E785 Hyperlipidemia, unspecified: Secondary | ICD-10-CM | POA: Diagnosis not present

## 2018-05-19 DIAGNOSIS — S2241XD Multiple fractures of ribs, right side, subsequent encounter for fracture with routine healing: Secondary | ICD-10-CM | POA: Diagnosis not present

## 2018-05-19 DIAGNOSIS — S065X0D Traumatic subdural hemorrhage without loss of consciousness, subsequent encounter: Secondary | ICD-10-CM | POA: Diagnosis not present

## 2018-05-19 DIAGNOSIS — S12201D Unspecified nondisplaced fracture of third cervical vertebra, subsequent encounter for fracture with routine healing: Secondary | ICD-10-CM | POA: Diagnosis not present

## 2018-05-19 DIAGNOSIS — S022XXD Fracture of nasal bones, subsequent encounter for fracture with routine healing: Secondary | ICD-10-CM | POA: Diagnosis not present

## 2018-05-19 DIAGNOSIS — E785 Hyperlipidemia, unspecified: Secondary | ICD-10-CM | POA: Diagnosis not present

## 2018-05-19 DIAGNOSIS — S066X0D Traumatic subarachnoid hemorrhage without loss of consciousness, subsequent encounter: Secondary | ICD-10-CM | POA: Diagnosis not present

## 2018-05-19 DIAGNOSIS — S02401D Maxillary fracture, unspecified, subsequent encounter for fracture with routine healing: Secondary | ICD-10-CM | POA: Diagnosis not present

## 2018-05-19 DIAGNOSIS — S0231XD Fracture of orbital floor, right side, subsequent encounter for fracture with routine healing: Secondary | ICD-10-CM | POA: Diagnosis not present

## 2018-05-19 DIAGNOSIS — I10 Essential (primary) hypertension: Secondary | ICD-10-CM | POA: Diagnosis not present

## 2018-05-19 DIAGNOSIS — S0240ED Zygomatic fracture, right side, subsequent encounter for fracture with routine healing: Secondary | ICD-10-CM | POA: Diagnosis not present

## 2018-05-21 DIAGNOSIS — Z4802 Encounter for removal of sutures: Secondary | ICD-10-CM | POA: Diagnosis not present

## 2018-05-21 DIAGNOSIS — Z5189 Encounter for other specified aftercare: Secondary | ICD-10-CM | POA: Diagnosis not present

## 2018-05-21 DIAGNOSIS — S2241XD Multiple fractures of ribs, right side, subsequent encounter for fracture with routine healing: Secondary | ICD-10-CM | POA: Diagnosis not present

## 2018-05-21 DIAGNOSIS — S0101XD Laceration without foreign body of scalp, subsequent encounter: Secondary | ICD-10-CM | POA: Diagnosis not present

## 2018-05-22 DIAGNOSIS — E785 Hyperlipidemia, unspecified: Secondary | ICD-10-CM | POA: Diagnosis not present

## 2018-05-22 DIAGNOSIS — S12201D Unspecified nondisplaced fracture of third cervical vertebra, subsequent encounter for fracture with routine healing: Secondary | ICD-10-CM | POA: Diagnosis not present

## 2018-05-22 DIAGNOSIS — S2241XD Multiple fractures of ribs, right side, subsequent encounter for fracture with routine healing: Secondary | ICD-10-CM | POA: Diagnosis not present

## 2018-05-22 DIAGNOSIS — I10 Essential (primary) hypertension: Secondary | ICD-10-CM | POA: Diagnosis not present

## 2018-05-22 DIAGNOSIS — S0240ED Zygomatic fracture, right side, subsequent encounter for fracture with routine healing: Secondary | ICD-10-CM | POA: Diagnosis not present

## 2018-05-22 DIAGNOSIS — S066X0D Traumatic subarachnoid hemorrhage without loss of consciousness, subsequent encounter: Secondary | ICD-10-CM | POA: Diagnosis not present

## 2018-05-22 DIAGNOSIS — S02401D Maxillary fracture, unspecified, subsequent encounter for fracture with routine healing: Secondary | ICD-10-CM | POA: Diagnosis not present

## 2018-05-22 DIAGNOSIS — S0231XD Fracture of orbital floor, right side, subsequent encounter for fracture with routine healing: Secondary | ICD-10-CM | POA: Diagnosis not present

## 2018-05-22 DIAGNOSIS — S022XXD Fracture of nasal bones, subsequent encounter for fracture with routine healing: Secondary | ICD-10-CM | POA: Diagnosis not present

## 2018-05-22 DIAGNOSIS — S065X0D Traumatic subdural hemorrhage without loss of consciousness, subsequent encounter: Secondary | ICD-10-CM | POA: Diagnosis not present

## 2018-05-25 DIAGNOSIS — M47813 Spondylosis without myelopathy or radiculopathy, cervicothoracic region: Secondary | ICD-10-CM | POA: Diagnosis not present

## 2018-05-25 DIAGNOSIS — M4312 Spondylolisthesis, cervical region: Secondary | ICD-10-CM | POA: Diagnosis not present

## 2018-05-25 DIAGNOSIS — S12201D Unspecified nondisplaced fracture of third cervical vertebra, subsequent encounter for fracture with routine healing: Secondary | ICD-10-CM | POA: Diagnosis not present

## 2018-05-25 DIAGNOSIS — S065X9D Traumatic subdural hemorrhage with loss of consciousness of unspecified duration, subsequent encounter: Secondary | ICD-10-CM | POA: Diagnosis not present

## 2018-05-25 DIAGNOSIS — S066X9D Traumatic subarachnoid hemorrhage with loss of consciousness of unspecified duration, subsequent encounter: Secondary | ICD-10-CM | POA: Diagnosis not present

## 2018-05-25 DIAGNOSIS — M47812 Spondylosis without myelopathy or radiculopathy, cervical region: Secondary | ICD-10-CM | POA: Diagnosis not present

## 2018-05-25 DIAGNOSIS — S12201S Unspecified nondisplaced fracture of third cervical vertebra, sequela: Secondary | ICD-10-CM | POA: Diagnosis not present

## 2018-05-26 DIAGNOSIS — S065X0D Traumatic subdural hemorrhage without loss of consciousness, subsequent encounter: Secondary | ICD-10-CM | POA: Diagnosis not present

## 2018-05-26 DIAGNOSIS — S0285XD Fracture of orbit, unspecified, subsequent encounter for fracture with routine healing: Secondary | ICD-10-CM | POA: Diagnosis not present

## 2018-05-26 DIAGNOSIS — S066X0D Traumatic subarachnoid hemorrhage without loss of consciousness, subsequent encounter: Secondary | ICD-10-CM | POA: Diagnosis not present

## 2018-05-26 DIAGNOSIS — E785 Hyperlipidemia, unspecified: Secondary | ICD-10-CM | POA: Diagnosis not present

## 2018-05-26 DIAGNOSIS — S12201D Unspecified nondisplaced fracture of third cervical vertebra, subsequent encounter for fracture with routine healing: Secondary | ICD-10-CM | POA: Diagnosis not present

## 2018-05-26 DIAGNOSIS — S022XXD Fracture of nasal bones, subsequent encounter for fracture with routine healing: Secondary | ICD-10-CM | POA: Diagnosis not present

## 2018-05-26 DIAGNOSIS — S0240ED Zygomatic fracture, right side, subsequent encounter for fracture with routine healing: Secondary | ICD-10-CM | POA: Diagnosis not present

## 2018-05-26 DIAGNOSIS — S2241XD Multiple fractures of ribs, right side, subsequent encounter for fracture with routine healing: Secondary | ICD-10-CM | POA: Diagnosis not present

## 2018-05-26 DIAGNOSIS — I10 Essential (primary) hypertension: Secondary | ICD-10-CM | POA: Diagnosis not present

## 2018-05-26 DIAGNOSIS — S0231XD Fracture of orbital floor, right side, subsequent encounter for fracture with routine healing: Secondary | ICD-10-CM | POA: Diagnosis not present

## 2018-05-26 DIAGNOSIS — X58XXXD Exposure to other specified factors, subsequent encounter: Secondary | ICD-10-CM | POA: Diagnosis not present

## 2018-05-26 DIAGNOSIS — S02401D Maxillary fracture, unspecified, subsequent encounter for fracture with routine healing: Secondary | ICD-10-CM | POA: Diagnosis not present

## 2018-05-27 DIAGNOSIS — E6609 Other obesity due to excess calories: Secondary | ICD-10-CM | POA: Diagnosis not present

## 2018-05-27 DIAGNOSIS — Z6834 Body mass index (BMI) 34.0-34.9, adult: Secondary | ICD-10-CM | POA: Diagnosis not present

## 2018-05-27 DIAGNOSIS — S129XXS Fracture of neck, unspecified, sequela: Secondary | ICD-10-CM | POA: Diagnosis not present

## 2018-05-27 DIAGNOSIS — I6200 Nontraumatic subdural hemorrhage, unspecified: Secondary | ICD-10-CM | POA: Diagnosis not present

## 2018-05-27 DIAGNOSIS — S2231XD Fracture of one rib, right side, subsequent encounter for fracture with routine healing: Secondary | ICD-10-CM | POA: Diagnosis not present

## 2018-05-27 DIAGNOSIS — Z1881 Retained glass fragments: Secondary | ICD-10-CM | POA: Diagnosis not present

## 2018-05-28 DIAGNOSIS — S0240CD Maxillary fracture, right side, subsequent encounter for fracture with routine healing: Secondary | ICD-10-CM | POA: Diagnosis not present

## 2018-05-28 DIAGNOSIS — S0231XD Fracture of orbital floor, right side, subsequent encounter for fracture with routine healing: Secondary | ICD-10-CM | POA: Diagnosis not present

## 2018-05-28 DIAGNOSIS — S022XXD Fracture of nasal bones, subsequent encounter for fracture with routine healing: Secondary | ICD-10-CM | POA: Diagnosis not present

## 2018-05-28 DIAGNOSIS — I7774 Dissection of vertebral artery: Secondary | ICD-10-CM | POA: Diagnosis not present

## 2018-05-28 DIAGNOSIS — Z7901 Long term (current) use of anticoagulants: Secondary | ICD-10-CM | POA: Diagnosis not present

## 2018-05-28 DIAGNOSIS — S12200G Unspecified displaced fracture of third cervical vertebra, subsequent encounter for fracture with delayed healing: Secondary | ICD-10-CM | POA: Diagnosis not present

## 2018-05-28 DIAGNOSIS — H532 Diplopia: Secondary | ICD-10-CM | POA: Diagnosis not present

## 2018-05-28 DIAGNOSIS — S0240ED Zygomatic fracture, right side, subsequent encounter for fracture with routine healing: Secondary | ICD-10-CM | POA: Diagnosis not present

## 2018-05-29 DIAGNOSIS — S066X0D Traumatic subarachnoid hemorrhage without loss of consciousness, subsequent encounter: Secondary | ICD-10-CM | POA: Diagnosis not present

## 2018-05-29 DIAGNOSIS — S12201D Unspecified nondisplaced fracture of third cervical vertebra, subsequent encounter for fracture with routine healing: Secondary | ICD-10-CM | POA: Diagnosis not present

## 2018-05-29 DIAGNOSIS — S2241XD Multiple fractures of ribs, right side, subsequent encounter for fracture with routine healing: Secondary | ICD-10-CM | POA: Diagnosis not present

## 2018-05-29 DIAGNOSIS — S02401D Maxillary fracture, unspecified, subsequent encounter for fracture with routine healing: Secondary | ICD-10-CM | POA: Diagnosis not present

## 2018-05-29 DIAGNOSIS — S0240ED Zygomatic fracture, right side, subsequent encounter for fracture with routine healing: Secondary | ICD-10-CM | POA: Diagnosis not present

## 2018-05-29 DIAGNOSIS — S0231XD Fracture of orbital floor, right side, subsequent encounter for fracture with routine healing: Secondary | ICD-10-CM | POA: Diagnosis not present

## 2018-05-29 DIAGNOSIS — E785 Hyperlipidemia, unspecified: Secondary | ICD-10-CM | POA: Diagnosis not present

## 2018-05-29 DIAGNOSIS — S022XXD Fracture of nasal bones, subsequent encounter for fracture with routine healing: Secondary | ICD-10-CM | POA: Diagnosis not present

## 2018-05-29 DIAGNOSIS — S065X0D Traumatic subdural hemorrhage without loss of consciousness, subsequent encounter: Secondary | ICD-10-CM | POA: Diagnosis not present

## 2018-05-29 DIAGNOSIS — I10 Essential (primary) hypertension: Secondary | ICD-10-CM | POA: Diagnosis not present

## 2018-06-17 DIAGNOSIS — S0240CA Maxillary fracture, right side, initial encounter for closed fracture: Secondary | ICD-10-CM | POA: Diagnosis not present

## 2018-06-17 DIAGNOSIS — S0240EA Zygomatic fracture, right side, initial encounter for closed fracture: Secondary | ICD-10-CM | POA: Diagnosis not present

## 2018-06-17 DIAGNOSIS — S12200A Unspecified displaced fracture of third cervical vertebra, initial encounter for closed fracture: Secondary | ICD-10-CM | POA: Diagnosis not present

## 2018-06-17 DIAGNOSIS — H532 Diplopia: Secondary | ICD-10-CM | POA: Diagnosis not present

## 2018-06-17 DIAGNOSIS — I7774 Dissection of vertebral artery: Secondary | ICD-10-CM | POA: Diagnosis not present

## 2018-06-17 DIAGNOSIS — S022XXA Fracture of nasal bones, initial encounter for closed fracture: Secondary | ICD-10-CM | POA: Diagnosis not present

## 2018-06-17 DIAGNOSIS — S0231XA Fracture of orbital floor, right side, initial encounter for closed fracture: Secondary | ICD-10-CM | POA: Diagnosis not present

## 2018-06-19 DIAGNOSIS — H532 Diplopia: Secondary | ICD-10-CM | POA: Diagnosis not present

## 2018-06-19 DIAGNOSIS — H04121 Dry eye syndrome of right lacrimal gland: Secondary | ICD-10-CM | POA: Diagnosis not present

## 2018-06-19 DIAGNOSIS — X58XXXD Exposure to other specified factors, subsequent encounter: Secondary | ICD-10-CM | POA: Diagnosis not present

## 2018-06-19 DIAGNOSIS — H05401 Unspecified enophthalmos, right eye: Secondary | ICD-10-CM | POA: Diagnosis not present

## 2018-06-19 DIAGNOSIS — H2513 Age-related nuclear cataract, bilateral: Secondary | ICD-10-CM | POA: Diagnosis not present

## 2018-06-19 DIAGNOSIS — S0285XD Fracture of orbit, unspecified, subsequent encounter for fracture with routine healing: Secondary | ICD-10-CM | POA: Diagnosis not present

## 2018-06-19 DIAGNOSIS — H538 Other visual disturbances: Secondary | ICD-10-CM | POA: Diagnosis not present

## 2018-06-29 DIAGNOSIS — M50322 Other cervical disc degeneration at C5-C6 level: Secondary | ICD-10-CM | POA: Diagnosis not present

## 2018-06-29 DIAGNOSIS — S065X9D Traumatic subdural hemorrhage with loss of consciousness of unspecified duration, subsequent encounter: Secondary | ICD-10-CM | POA: Diagnosis not present

## 2018-06-29 DIAGNOSIS — M4312 Spondylolisthesis, cervical region: Secondary | ICD-10-CM | POA: Diagnosis not present

## 2018-06-29 DIAGNOSIS — S065X9A Traumatic subdural hemorrhage with loss of consciousness of unspecified duration, initial encounter: Secondary | ICD-10-CM | POA: Diagnosis not present

## 2018-06-29 DIAGNOSIS — M5032 Other cervical disc degeneration, mid-cervical region, unspecified level: Secondary | ICD-10-CM | POA: Diagnosis not present

## 2018-06-29 DIAGNOSIS — M50321 Other cervical disc degeneration at C4-C5 level: Secondary | ICD-10-CM | POA: Diagnosis not present

## 2018-06-29 DIAGNOSIS — M50323 Other cervical disc degeneration at C6-C7 level: Secondary | ICD-10-CM | POA: Diagnosis not present

## 2018-06-29 DIAGNOSIS — S12200D Unspecified displaced fracture of third cervical vertebra, subsequent encounter for fracture with routine healing: Secondary | ICD-10-CM | POA: Diagnosis not present

## 2018-06-29 DIAGNOSIS — M4313 Spondylolisthesis, cervicothoracic region: Secondary | ICD-10-CM | POA: Diagnosis not present

## 2018-06-29 DIAGNOSIS — T1490XA Injury, unspecified, initial encounter: Secondary | ICD-10-CM | POA: Diagnosis not present

## 2018-06-29 DIAGNOSIS — Z8781 Personal history of (healed) traumatic fracture: Secondary | ICD-10-CM | POA: Diagnosis not present

## 2018-06-29 DIAGNOSIS — I609 Nontraumatic subarachnoid hemorrhage, unspecified: Secondary | ICD-10-CM | POA: Diagnosis not present

## 2018-07-02 DIAGNOSIS — S0240CD Maxillary fracture, right side, subsequent encounter for fracture with routine healing: Secondary | ICD-10-CM | POA: Diagnosis not present

## 2018-07-02 DIAGNOSIS — H532 Diplopia: Secondary | ICD-10-CM | POA: Diagnosis not present

## 2018-07-02 DIAGNOSIS — S12200D Unspecified displaced fracture of third cervical vertebra, subsequent encounter for fracture with routine healing: Secondary | ICD-10-CM | POA: Diagnosis not present

## 2018-07-02 DIAGNOSIS — Z7982 Long term (current) use of aspirin: Secondary | ICD-10-CM | POA: Diagnosis not present

## 2018-07-02 DIAGNOSIS — S0231XD Fracture of orbital floor, right side, subsequent encounter for fracture with routine healing: Secondary | ICD-10-CM | POA: Diagnosis not present

## 2018-07-02 DIAGNOSIS — S0240ED Zygomatic fracture, right side, subsequent encounter for fracture with routine healing: Secondary | ICD-10-CM | POA: Diagnosis not present

## 2018-07-02 DIAGNOSIS — I7774 Dissection of vertebral artery: Secondary | ICD-10-CM | POA: Diagnosis not present

## 2018-07-02 DIAGNOSIS — S022XXD Fracture of nasal bones, subsequent encounter for fracture with routine healing: Secondary | ICD-10-CM | POA: Diagnosis not present

## 2018-07-30 DIAGNOSIS — M50321 Other cervical disc degeneration at C4-C5 level: Secondary | ICD-10-CM | POA: Diagnosis not present

## 2018-07-30 DIAGNOSIS — Z87820 Personal history of traumatic brain injury: Secondary | ICD-10-CM | POA: Diagnosis not present

## 2018-07-30 DIAGNOSIS — S12200D Unspecified displaced fracture of third cervical vertebra, subsequent encounter for fracture with routine healing: Secondary | ICD-10-CM | POA: Diagnosis not present

## 2018-07-30 DIAGNOSIS — R2689 Other abnormalities of gait and mobility: Secondary | ICD-10-CM | POA: Diagnosis not present

## 2018-07-30 DIAGNOSIS — M47812 Spondylosis without myelopathy or radiculopathy, cervical region: Secondary | ICD-10-CM | POA: Diagnosis not present

## 2018-07-30 DIAGNOSIS — M4312 Spondylolisthesis, cervical region: Secondary | ICD-10-CM | POA: Diagnosis not present

## 2018-07-30 DIAGNOSIS — Z8781 Personal history of (healed) traumatic fracture: Secondary | ICD-10-CM | POA: Diagnosis not present

## 2018-07-30 DIAGNOSIS — R42 Dizziness and giddiness: Secondary | ICD-10-CM | POA: Diagnosis not present

## 2018-08-04 DIAGNOSIS — R0981 Nasal congestion: Secondary | ICD-10-CM | POA: Diagnosis not present

## 2018-08-04 DIAGNOSIS — Z1331 Encounter for screening for depression: Secondary | ICD-10-CM | POA: Diagnosis not present

## 2018-08-04 DIAGNOSIS — Z713 Dietary counseling and surveillance: Secondary | ICD-10-CM | POA: Diagnosis not present

## 2018-08-04 DIAGNOSIS — Z139 Encounter for screening, unspecified: Secondary | ICD-10-CM | POA: Diagnosis not present

## 2018-08-04 DIAGNOSIS — Z7182 Exercise counseling: Secondary | ICD-10-CM | POA: Diagnosis not present

## 2018-08-04 DIAGNOSIS — E782 Mixed hyperlipidemia: Secondary | ICD-10-CM | POA: Diagnosis not present

## 2018-08-04 DIAGNOSIS — J069 Acute upper respiratory infection, unspecified: Secondary | ICD-10-CM | POA: Diagnosis not present

## 2018-08-04 DIAGNOSIS — I10 Essential (primary) hypertension: Secondary | ICD-10-CM | POA: Diagnosis not present

## 2018-08-04 DIAGNOSIS — Z6832 Body mass index (BMI) 32.0-32.9, adult: Secondary | ICD-10-CM | POA: Diagnosis not present

## 2018-09-10 DIAGNOSIS — E6609 Other obesity due to excess calories: Secondary | ICD-10-CM | POA: Diagnosis not present

## 2018-09-10 DIAGNOSIS — Z1389 Encounter for screening for other disorder: Secondary | ICD-10-CM | POA: Diagnosis not present

## 2018-09-10 DIAGNOSIS — Z0001 Encounter for general adult medical examination with abnormal findings: Secondary | ICD-10-CM | POA: Diagnosis not present

## 2018-09-10 DIAGNOSIS — R7309 Other abnormal glucose: Secondary | ICD-10-CM | POA: Diagnosis not present

## 2018-09-10 DIAGNOSIS — Z6832 Body mass index (BMI) 32.0-32.9, adult: Secondary | ICD-10-CM | POA: Diagnosis not present

## 2018-09-10 DIAGNOSIS — I10 Essential (primary) hypertension: Secondary | ICD-10-CM | POA: Diagnosis not present

## 2018-09-10 DIAGNOSIS — I6200 Nontraumatic subdural hemorrhage, unspecified: Secondary | ICD-10-CM | POA: Diagnosis not present

## 2018-09-10 DIAGNOSIS — R946 Abnormal results of thyroid function studies: Secondary | ICD-10-CM | POA: Diagnosis not present

## 2018-09-10 DIAGNOSIS — E7849 Other hyperlipidemia: Secondary | ICD-10-CM | POA: Diagnosis not present

## 2018-10-21 DIAGNOSIS — K5900 Constipation, unspecified: Secondary | ICD-10-CM | POA: Diagnosis not present

## 2018-10-21 DIAGNOSIS — Z8249 Family history of ischemic heart disease and other diseases of the circulatory system: Secondary | ICD-10-CM | POA: Diagnosis not present

## 2018-10-21 DIAGNOSIS — Z7982 Long term (current) use of aspirin: Secondary | ICD-10-CM | POA: Diagnosis not present

## 2018-10-21 DIAGNOSIS — I10 Essential (primary) hypertension: Secondary | ICD-10-CM | POA: Diagnosis not present

## 2018-10-21 DIAGNOSIS — E669 Obesity, unspecified: Secondary | ICD-10-CM | POA: Diagnosis not present

## 2018-10-21 DIAGNOSIS — E785 Hyperlipidemia, unspecified: Secondary | ICD-10-CM | POA: Diagnosis not present

## 2018-10-21 DIAGNOSIS — Z823 Family history of stroke: Secondary | ICD-10-CM | POA: Diagnosis not present

## 2018-10-28 DIAGNOSIS — H05421 Enophthalmos due to trauma or surgery, right eye: Secondary | ICD-10-CM | POA: Diagnosis not present

## 2018-10-28 DIAGNOSIS — H538 Other visual disturbances: Secondary | ICD-10-CM | POA: Diagnosis not present

## 2018-10-28 DIAGNOSIS — H532 Diplopia: Secondary | ICD-10-CM | POA: Diagnosis not present

## 2018-11-02 DIAGNOSIS — M50321 Other cervical disc degeneration at C4-C5 level: Secondary | ICD-10-CM | POA: Diagnosis not present

## 2018-11-02 DIAGNOSIS — M4312 Spondylolisthesis, cervical region: Secondary | ICD-10-CM | POA: Diagnosis not present

## 2018-11-02 DIAGNOSIS — S12200D Unspecified displaced fracture of third cervical vertebra, subsequent encounter for fracture with routine healing: Secondary | ICD-10-CM | POA: Diagnosis not present

## 2018-11-02 DIAGNOSIS — S065X9D Traumatic subdural hemorrhage with loss of consciousness of unspecified duration, subsequent encounter: Secondary | ICD-10-CM | POA: Diagnosis not present

## 2018-11-02 DIAGNOSIS — Z8781 Personal history of (healed) traumatic fracture: Secondary | ICD-10-CM | POA: Diagnosis not present

## 2018-11-02 DIAGNOSIS — S0101XD Laceration without foreign body of scalp, subsequent encounter: Secondary | ICD-10-CM | POA: Diagnosis not present

## 2018-11-17 DIAGNOSIS — S0231XD Fracture of orbital floor, right side, subsequent encounter for fracture with routine healing: Secondary | ICD-10-CM | POA: Diagnosis not present

## 2018-11-17 DIAGNOSIS — S0240CD Maxillary fracture, right side, subsequent encounter for fracture with routine healing: Secondary | ICD-10-CM | POA: Diagnosis not present

## 2018-11-17 DIAGNOSIS — S0285XD Fracture of orbit, unspecified, subsequent encounter for fracture with routine healing: Secondary | ICD-10-CM | POA: Diagnosis not present

## 2018-11-25 DIAGNOSIS — Z8781 Personal history of (healed) traumatic fracture: Secondary | ICD-10-CM | POA: Diagnosis not present

## 2018-11-25 DIAGNOSIS — H5022 Vertical strabismus, left eye: Secondary | ICD-10-CM | POA: Diagnosis not present

## 2019-01-14 DIAGNOSIS — H532 Diplopia: Secondary | ICD-10-CM | POA: Diagnosis not present

## 2019-01-14 DIAGNOSIS — S0231XG Fracture of orbital floor, right side, subsequent encounter for fracture with delayed healing: Secondary | ICD-10-CM | POA: Diagnosis not present

## 2019-01-14 DIAGNOSIS — H05401 Unspecified enophthalmos, right eye: Secondary | ICD-10-CM | POA: Diagnosis not present

## 2019-02-22 ENCOUNTER — Other Ambulatory Visit (HOSPITAL_COMMUNITY): Payer: Self-pay | Admitting: Family Medicine

## 2019-02-22 DIAGNOSIS — Z1231 Encounter for screening mammogram for malignant neoplasm of breast: Secondary | ICD-10-CM

## 2019-03-15 DIAGNOSIS — Z6833 Body mass index (BMI) 33.0-33.9, adult: Secondary | ICD-10-CM | POA: Diagnosis not present

## 2019-03-15 DIAGNOSIS — E7849 Other hyperlipidemia: Secondary | ICD-10-CM | POA: Diagnosis not present

## 2019-03-15 DIAGNOSIS — R7309 Other abnormal glucose: Secondary | ICD-10-CM | POA: Diagnosis not present

## 2019-03-15 DIAGNOSIS — Z23 Encounter for immunization: Secondary | ICD-10-CM | POA: Diagnosis not present

## 2019-03-15 DIAGNOSIS — R946 Abnormal results of thyroid function studies: Secondary | ICD-10-CM | POA: Diagnosis not present

## 2019-03-15 DIAGNOSIS — I6200 Nontraumatic subdural hemorrhage, unspecified: Secondary | ICD-10-CM | POA: Diagnosis not present

## 2019-03-15 DIAGNOSIS — I10 Essential (primary) hypertension: Secondary | ICD-10-CM | POA: Diagnosis not present

## 2019-03-15 DIAGNOSIS — S129XXS Fracture of neck, unspecified, sequela: Secondary | ICD-10-CM | POA: Diagnosis not present

## 2019-03-15 DIAGNOSIS — E6609 Other obesity due to excess calories: Secondary | ICD-10-CM | POA: Diagnosis not present

## 2019-03-24 ENCOUNTER — Encounter: Payer: Self-pay | Admitting: Cardiovascular Disease

## 2019-03-24 ENCOUNTER — Other Ambulatory Visit: Payer: Self-pay

## 2019-03-24 ENCOUNTER — Ambulatory Visit: Payer: Medicare HMO | Admitting: Cardiovascular Disease

## 2019-03-24 VITALS — BP 139/76 | HR 84 | Ht 66.0 in | Wt 206.0 lb

## 2019-03-24 DIAGNOSIS — I447 Left bundle-branch block, unspecified: Secondary | ICD-10-CM | POA: Diagnosis not present

## 2019-03-24 DIAGNOSIS — Z8249 Family history of ischemic heart disease and other diseases of the circulatory system: Secondary | ICD-10-CM | POA: Diagnosis not present

## 2019-03-24 DIAGNOSIS — E782 Mixed hyperlipidemia: Secondary | ICD-10-CM

## 2019-03-24 DIAGNOSIS — I10 Essential (primary) hypertension: Secondary | ICD-10-CM | POA: Diagnosis not present

## 2019-03-24 DIAGNOSIS — E785 Hyperlipidemia, unspecified: Secondary | ICD-10-CM | POA: Diagnosis not present

## 2019-03-24 MED ORDER — PRAVASTATIN SODIUM 40 MG PO TABS: 40.0000 mg | ORAL_TABLET | Freq: Every day | ORAL | 3 refills | Status: DC

## 2019-03-24 NOTE — Assessment & Plan Note (Signed)
History of hyperlipidemia on low-dose pravastatin with lipid profile performed 03/15/2019 revealing total cholesterol 255, LDL 116 HDL 72.  Given her family history I am going to increase her pravastatin from 10 mg to 40 mg a day and we will recheck a lipid liver profile in 2 months

## 2019-03-24 NOTE — Assessment & Plan Note (Signed)
History of essential hypertension with blood pressure measured today 139/76.  She is on Lotensin/hydrochlorothiazide.

## 2019-03-24 NOTE — Patient Instructions (Signed)
Medication Instructions:  Increase pravastatin to 40mg  daily.  If you need a refill on your cardiac medications before your next appointment, please call your pharmacy.   Lab work: Come back in 2 MONTHS for Lipid and Hepatic Function If you have labs (blood work) drawn today and your tests are completely normal, you will receive your results only by: MyChart Message (if you have MyChart) OR A paper copy in the mail If you have any lab test that is abnormal or we need to change your treatment, we will call you to review the results.  Testing/Procedures: NONE  Follow-Up: At [facility], you and your health needs are our priority.  As part of our continuing mission to provide you with exceptional heart care, we have created designated Provider Care Teams.  These Care Teams include your primary Cardiologist (physician) and Advanced Practice Providers (APPs -  Physician Assistants and Nurse Practitioners) who all work together to provide you with the care you need, when you need it. You may see Dr Gwenlyn Found or one of the following Advanced Practice Providers on your designated Care Team:    Kerin Ransom, PA-C  [place], [place]  Coletta Memos, [place]   Your physician wants you to follow-up in: 1 year. You will receive a reminder letter in the mail two months in advance. If you don't receive a letter, please call our office to schedule the follow-up appointment.

## 2019-03-24 NOTE — Assessment & Plan Note (Signed)
Chronic. 
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
## 2019-04-02 ENCOUNTER — Other Ambulatory Visit: Payer: Self-pay

## 2019-04-02 ENCOUNTER — Ambulatory Visit (HOSPITAL_COMMUNITY)
Admission: RE | Admit: 2019-04-02 | Discharge: 2019-04-02 | Disposition: A | Discharge status: Home / Self Care | Payer: Medicare HMO | Source: Ambulatory Visit | Attending: Family Medicine | Admitting: Family Medicine

## 2019-04-02 DIAGNOSIS — Z1231 Encounter for screening mammogram for malignant neoplasm of breast: Secondary | ICD-10-CM | POA: Diagnosis not present

## 2019-05-04 DIAGNOSIS — M4312 Spondylolisthesis, cervical region: Secondary | ICD-10-CM | POA: Diagnosis not present

## 2019-05-04 DIAGNOSIS — M47812 Spondylosis without myelopathy or radiculopathy, cervical region: Secondary | ICD-10-CM | POA: Diagnosis not present

## 2019-05-04 DIAGNOSIS — S065X9D Traumatic subdural hemorrhage with loss of consciousness of unspecified duration, subsequent encounter: Secondary | ICD-10-CM | POA: Diagnosis not present

## 2019-05-04 DIAGNOSIS — M47813 Spondylosis without myelopathy or radiculopathy, cervicothoracic region: Secondary | ICD-10-CM | POA: Diagnosis not present

## 2019-05-04 DIAGNOSIS — Z8781 Personal history of (healed) traumatic fracture: Secondary | ICD-10-CM | POA: Diagnosis not present

## 2019-05-04 DIAGNOSIS — M4313 Spondylolisthesis, cervicothoracic region: Secondary | ICD-10-CM | POA: Diagnosis not present

## 2019-05-04 DIAGNOSIS — S12200D Unspecified displaced fracture of third cervical vertebra, subsequent encounter for fracture with routine healing: Secondary | ICD-10-CM | POA: Diagnosis not present

## 2019-05-25 DIAGNOSIS — S0231XG Fracture of orbital floor, right side, subsequent encounter for fracture with delayed healing: Secondary | ICD-10-CM | POA: Diagnosis not present

## 2019-05-28 DIAGNOSIS — I10 Essential (primary) hypertension: Secondary | ICD-10-CM | POA: Diagnosis not present

## 2019-05-28 DIAGNOSIS — E785 Hyperlipidemia, unspecified: Secondary | ICD-10-CM | POA: Diagnosis not present

## 2019-05-28 DIAGNOSIS — Z20828 Contact with and (suspected) exposure to other viral communicable diseases: Secondary | ICD-10-CM | POA: Diagnosis not present

## 2019-05-28 DIAGNOSIS — Z20822 Contact with and (suspected) exposure to covid-19: Secondary | ICD-10-CM | POA: Diagnosis not present

## 2019-05-28 DIAGNOSIS — Z8249 Family history of ischemic heart disease and other diseases of the circulatory system: Secondary | ICD-10-CM | POA: Diagnosis not present

## 2019-05-28 LAB — HEPATIC FUNCTION PANEL
ALT: 11 IU/L (ref 0–32)
AST: 16 IU/L (ref 0–40)
Albumin: 4.5 g/dL (ref 3.8–4.8)
Alkaline Phosphatase: 94 IU/L (ref 39–117)
Bilirubin Total: 0.4 mg/dL (ref 0.0–1.2)
Bilirubin, Direct: 0.1 mg/dL (ref 0.00–0.40)
Total Protein: 6.8 g/dL (ref 6.0–8.5)

## 2019-05-28 LAB — LIPID PANEL
Chol/HDL Ratio: 3.2 ratio (ref 0.0–4.4)
Cholesterol, Total: 236 mg/dL — ABNORMAL HIGH (ref 100–199)
HDL: 73 mg/dL (ref >39)
LDL Chol Calc (NIH): 145 mg/dL — ABNORMAL HIGH (ref 0–99)
Triglycerides: 102 mg/dL (ref 0–149)
VLDL Cholesterol Cal: 18 mg/dL (ref 5–40)

## 2019-05-31 ENCOUNTER — Telehealth: Payer: Self-pay

## 2019-05-31 DIAGNOSIS — H25813 Combined forms of age-related cataract, bilateral: Secondary | ICD-10-CM | POA: Diagnosis not present

## 2019-05-31 DIAGNOSIS — H532 Diplopia: Secondary | ICD-10-CM | POA: Diagnosis not present

## 2019-05-31 MED ORDER — ATORVASTATIN CALCIUM 80 MG PO TABS: 40.0000 mg | ORAL_TABLET | Freq: Every day | ORAL | 3 refills | Status: AC

## 2019-05-31 NOTE — Telephone Encounter (Signed)
LVM for pt to call office back for results.

## 2019-05-31 NOTE — Addendum Note (Signed)
Addended by: Reola Mosher on: 05/31/2019 11:26 AM   Modules accepted: Orders

## 2019-05-31 NOTE — Telephone Encounter (Signed)
-----   Message from Runell Gess, MD sent at 05/31/2019 11:13 AM EST ----- Not at goal for primary prevention.  Change pravastatin to atorvastatin 80 mg and recheck lipid liver profile in 2 months

## 2019-06-04 DIAGNOSIS — H0530 Unspecified deformity of orbit: Secondary | ICD-10-CM | POA: Diagnosis not present

## 2019-06-04 DIAGNOSIS — S0231XA Fracture of orbital floor, right side, initial encounter for closed fracture: Secondary | ICD-10-CM | POA: Diagnosis not present

## 2019-06-04 DIAGNOSIS — M952 Other acquired deformity of head: Secondary | ICD-10-CM | POA: Diagnosis not present

## 2019-08-02 DIAGNOSIS — E782 Mixed hyperlipidemia: Secondary | ICD-10-CM | POA: Diagnosis not present

## 2019-08-02 DIAGNOSIS — E785 Hyperlipidemia, unspecified: Secondary | ICD-10-CM | POA: Diagnosis not present

## 2019-08-03 LAB — LIPID PANEL
Chol/HDL Ratio: 2.5 ratio (ref 0.0–4.4)
Cholesterol, Total: 172 mg/dL (ref 100–199)
HDL: 70 mg/dL (ref >39)
LDL Chol Calc (NIH): 83 mg/dL (ref 0–99)
Triglycerides: 110 mg/dL (ref 0–149)
VLDL Cholesterol Cal: 19 mg/dL (ref 5–40)

## 2019-10-20 DIAGNOSIS — J069 Acute upper respiratory infection, unspecified: Secondary | ICD-10-CM | POA: Diagnosis not present

## 2019-10-20 DIAGNOSIS — Z1331 Encounter for screening for depression: Secondary | ICD-10-CM | POA: Diagnosis not present

## 2019-10-20 DIAGNOSIS — E782 Mixed hyperlipidemia: Secondary | ICD-10-CM | POA: Diagnosis not present

## 2019-10-20 DIAGNOSIS — Z6833 Body mass index (BMI) 33.0-33.9, adult: Secondary | ICD-10-CM | POA: Diagnosis not present

## 2019-10-20 DIAGNOSIS — I10 Essential (primary) hypertension: Secondary | ICD-10-CM | POA: Diagnosis not present

## 2019-10-20 DIAGNOSIS — Z139 Encounter for screening, unspecified: Secondary | ICD-10-CM | POA: Diagnosis not present

## 2019-10-20 DIAGNOSIS — Z713 Dietary counseling and surveillance: Secondary | ICD-10-CM | POA: Diagnosis not present

## 2019-10-20 DIAGNOSIS — Z7182 Exercise counseling: Secondary | ICD-10-CM | POA: Diagnosis not present

## 2019-10-20 DIAGNOSIS — R0981 Nasal congestion: Secondary | ICD-10-CM | POA: Diagnosis not present

## 2019-10-28 DIAGNOSIS — E7849 Other hyperlipidemia: Secondary | ICD-10-CM | POA: Diagnosis not present

## 2019-10-28 DIAGNOSIS — R7309 Other abnormal glucose: Secondary | ICD-10-CM | POA: Diagnosis not present

## 2019-10-28 DIAGNOSIS — E6609 Other obesity due to excess calories: Secondary | ICD-10-CM | POA: Diagnosis not present

## 2019-10-28 DIAGNOSIS — E039 Hypothyroidism, unspecified: Secondary | ICD-10-CM | POA: Diagnosis not present

## 2019-10-28 DIAGNOSIS — E782 Mixed hyperlipidemia: Secondary | ICD-10-CM | POA: Diagnosis not present

## 2019-10-28 DIAGNOSIS — Z0001 Encounter for general adult medical examination with abnormal findings: Secondary | ICD-10-CM | POA: Diagnosis not present

## 2019-10-28 DIAGNOSIS — Z6834 Body mass index (BMI) 34.0-34.9, adult: Secondary | ICD-10-CM | POA: Diagnosis not present

## 2019-10-28 DIAGNOSIS — Z1389 Encounter for screening for other disorder: Secondary | ICD-10-CM | POA: Diagnosis not present

## 2019-12-29 DIAGNOSIS — Z20822 Contact with and (suspected) exposure to covid-19: Secondary | ICD-10-CM | POA: Diagnosis not present

## 2019-12-29 DIAGNOSIS — J069 Acute upper respiratory infection, unspecified: Secondary | ICD-10-CM | POA: Diagnosis not present

## 2020-01-03 DIAGNOSIS — J101 Influenza due to other identified influenza virus with other respiratory manifestations: Secondary | ICD-10-CM | POA: Diagnosis not present

## 2020-01-03 DIAGNOSIS — Z03818 Encounter for observation for suspected exposure to other biological agents ruled out: Secondary | ICD-10-CM | POA: Diagnosis not present

## 2020-01-10 ENCOUNTER — Inpatient Hospital Stay (HOSPITAL_COMMUNITY)
Admission: EM | Admit: 2020-01-10 | Discharge: 2020-01-14 | DRG: 177 | Disposition: A | Discharge status: Home / Self Care | Payer: Medicare HMO | Attending: Internal Medicine | Admitting: Internal Medicine

## 2020-01-10 ENCOUNTER — Encounter (HOSPITAL_COMMUNITY): Payer: Self-pay | Admitting: Emergency Medicine

## 2020-01-10 ENCOUNTER — Other Ambulatory Visit: Payer: Self-pay

## 2020-01-10 ENCOUNTER — Emergency Department (HOSPITAL_COMMUNITY): Payer: Medicare HMO

## 2020-01-10 DIAGNOSIS — E872 Acidosis, unspecified: Secondary | ICD-10-CM | POA: Diagnosis present

## 2020-01-10 DIAGNOSIS — U071 COVID-19: Principal | ICD-10-CM | POA: Diagnosis present

## 2020-01-10 DIAGNOSIS — R739 Hyperglycemia, unspecified: Secondary | ICD-10-CM | POA: Diagnosis present

## 2020-01-10 DIAGNOSIS — Z83438 Family history of other disorder of lipoprotein metabolism and other lipidemia: Secondary | ICD-10-CM

## 2020-01-10 DIAGNOSIS — R0602 Shortness of breath: Secondary | ICD-10-CM | POA: Diagnosis not present

## 2020-01-10 DIAGNOSIS — Z79899 Other long term (current) drug therapy: Secondary | ICD-10-CM

## 2020-01-10 DIAGNOSIS — J101 Influenza due to other identified influenza virus with other respiratory manifestations: Secondary | ICD-10-CM | POA: Diagnosis present

## 2020-01-10 DIAGNOSIS — E669 Obesity, unspecified: Secondary | ICD-10-CM | POA: Diagnosis present

## 2020-01-10 DIAGNOSIS — Z8249 Family history of ischemic heart disease and other diseases of the circulatory system: Secondary | ICD-10-CM

## 2020-01-10 DIAGNOSIS — J9601 Acute respiratory failure with hypoxia: Secondary | ICD-10-CM | POA: Diagnosis not present

## 2020-01-10 DIAGNOSIS — I10 Essential (primary) hypertension: Secondary | ICD-10-CM | POA: Diagnosis present

## 2020-01-10 DIAGNOSIS — Z6834 Body mass index (BMI) 34.0-34.9, adult: Secondary | ICD-10-CM

## 2020-01-10 DIAGNOSIS — E785 Hyperlipidemia, unspecified: Secondary | ICD-10-CM | POA: Diagnosis present

## 2020-01-10 DIAGNOSIS — J9811 Atelectasis: Secondary | ICD-10-CM | POA: Diagnosis not present

## 2020-01-10 DIAGNOSIS — J1282 Pneumonia due to coronavirus disease 2019: Secondary | ICD-10-CM | POA: Diagnosis present

## 2020-01-10 LAB — COMPREHENSIVE METABOLIC PANEL
ALT: 26 U/L (ref 0–44)
AST: 43 U/L — ABNORMAL HIGH (ref 15–41)
Albumin: 3.3 g/dL — ABNORMAL LOW (ref 3.5–5.0)
Alkaline Phosphatase: 53 U/L (ref 38–126)
Anion gap: 14 (ref 5–15)
BUN: 17 mg/dL (ref 8–23)
CO2: 20 mmol/L — ABNORMAL LOW (ref 22–32)
Calcium: 9.4 mg/dL (ref 8.9–10.3)
Chloride: 101 mmol/L (ref 98–111)
Creatinine, Ser: 0.98 mg/dL (ref 0.44–1.00)
GFR calc Af Amer: >60 mL/min (ref >60)
GFR calc non Af Amer: 59 mL/min — ABNORMAL LOW (ref >60)
Glucose, Bld: 240 mg/dL — ABNORMAL HIGH (ref 70–99)
Potassium: 3 mmol/L — ABNORMAL LOW (ref 3.5–5.1)
Sodium: 135 mmol/L (ref 135–145)
Total Bilirubin: 1.1 mg/dL (ref 0.3–1.2)
Total Protein: 7.1 g/dL (ref 6.5–8.1)

## 2020-01-10 LAB — CBC
HCT: 45.3 % (ref 36.0–46.0)
Hemoglobin: 14.5 g/dL (ref 12.0–15.0)
MCH: 27.2 pg (ref 26.0–34.0)
MCHC: 32 g/dL (ref 30.0–36.0)
MCV: 84.8 fL (ref 80.0–100.0)
Platelets: 272 10*3/uL (ref 150–400)
RBC: 5.34 MIL/uL — ABNORMAL HIGH (ref 3.87–5.11)
RDW: 12.6 % (ref 11.5–15.5)
WBC: 7.2 10*3/uL (ref 4.0–10.5)
nRBC: 0 % (ref 0.0–0.2)

## 2020-01-10 LAB — D-DIMER, QUANTITATIVE: D-Dimer, Quant: 1.87 ug/mL-FEU — ABNORMAL HIGH (ref 0.00–0.50)

## 2020-01-10 LAB — FIBRINOGEN: Fibrinogen: 732 mg/dL — ABNORMAL HIGH (ref 210–475)

## 2020-01-10 LAB — PROCALCITONIN: Procalcitonin: <0.1 ng/mL

## 2020-01-10 LAB — LACTATE DEHYDROGENASE: LDH: 291 U/L — ABNORMAL HIGH (ref 98–192)

## 2020-01-10 LAB — LACTIC ACID, PLASMA
Lactic Acid, Venous: 2.6 mmol/L (ref 0.5–1.9)
Lactic Acid, Venous: 1.3 mmol/L (ref 0.5–1.9)

## 2020-01-10 LAB — C-REACTIVE PROTEIN: CRP: 9.5 mg/dL — ABNORMAL HIGH (ref <1.0)

## 2020-01-10 LAB — FERRITIN: Ferritin: 302 ng/mL (ref 11–307)

## 2020-01-10 LAB — TRIGLYCERIDES: Triglycerides: 125 mg/dL (ref <150)

## 2020-01-10 LAB — SARS CORONAVIRUS 2 BY RT PCR (HOSPITAL ORDER, PERFORMED IN ~~LOC~~ HOSPITAL LAB): SARS Coronavirus 2: POSITIVE — AB

## 2020-01-10 MED ORDER — DEXAMETHASONE 6 MG PO TABS
6.0000 mg | ORAL_TABLET | ORAL | Status: DC
  Administered 2020-01-10 – 2020-01-13 (×4): 6 mg via ORAL
  Filled 2020-01-10 (×2): qty 2
  Filled 2020-01-10 (×2): qty 1

## 2020-01-10 MED ORDER — ACETAMINOPHEN 325 MG PO TABS
650.0000 mg | ORAL_TABLET | Freq: Once | ORAL | Status: AC
  Administered 2020-01-10: 650 mg via ORAL
  Filled 2020-01-10: qty 2

## 2020-01-10 MED ORDER — SODIUM CHLORIDE 0.9 % IV SOLN
1.0000 g | Freq: Once | INTRAVENOUS | Status: AC
  Administered 2020-01-10: 1 g via INTRAVENOUS
  Filled 2020-01-10: qty 10

## 2020-01-10 MED ORDER — SODIUM CHLORIDE 0.9 % IV SOLN
200.0000 mg | Freq: Once | INTRAVENOUS | Status: AC
  Administered 2020-01-10: 200 mg via INTRAVENOUS
  Filled 2020-01-10: qty 40

## 2020-01-10 MED ORDER — SODIUM CHLORIDE 0.9 % IV SOLN
100.0000 mg | Freq: Every day | INTRAVENOUS | Status: AC
  Administered 2020-01-11 – 2020-01-14 (×4): 100 mg via INTRAVENOUS
  Filled 2020-01-10 (×5): qty 20

## 2020-01-10 MED ORDER — SENNA 8.6 MG PO TABS
1.0000 | ORAL_TABLET | Freq: Two times a day (BID) | ORAL | Status: DC
  Administered 2020-01-10 – 2020-01-12 (×3): 8.6 mg via ORAL
  Filled 2020-01-10 (×4): qty 1

## 2020-01-10 MED ORDER — IOHEXOL 350 MG/ML SOLN
60.0000 mL | Freq: Once | INTRAVENOUS | Status: AC | PRN
  Administered 2020-01-10: 60 mL via INTRAVENOUS

## 2020-01-10 MED ORDER — ATORVASTATIN CALCIUM 40 MG PO TABS
40.0000 mg | ORAL_TABLET | Freq: Every day | ORAL | Status: DC
  Administered 2020-01-10 – 2020-01-14 (×5): 40 mg via ORAL
  Filled 2020-01-10 (×2): qty 1
  Filled 2020-01-10: qty 4
  Filled 2020-01-10 (×2): qty 1

## 2020-01-10 MED ORDER — ACETAMINOPHEN 325 MG PO TABS
650.0000 mg | ORAL_TABLET | Freq: Four times a day (QID) | ORAL | Status: DC | PRN
  Administered 2020-01-11: 650 mg via ORAL
  Filled 2020-01-10: qty 2

## 2020-01-10 MED ORDER — KETOROLAC TROMETHAMINE 15 MG/ML IJ SOLN
15.0000 mg | Freq: Once | INTRAMUSCULAR | Status: AC
  Administered 2020-01-10: 15 mg via INTRAVENOUS
  Filled 2020-01-10: qty 1

## 2020-01-10 MED ORDER — ONDANSETRON HCL 4 MG/2ML IJ SOLN: 4.0000 mg | Freq: Four times a day (QID) | INTRAMUSCULAR | Status: DC | PRN

## 2020-01-10 MED ORDER — ONDANSETRON HCL 4 MG PO TABS: 4.0000 mg | ORAL_TABLET | Freq: Four times a day (QID) | ORAL | Status: DC | PRN

## 2020-01-10 MED ORDER — ENOXAPARIN SODIUM 40 MG/0.4ML ~~LOC~~ SOLN
40.0000 mg | SUBCUTANEOUS | Status: DC
  Administered 2020-01-10 – 2020-01-13 (×4): 40 mg via SUBCUTANEOUS
  Filled 2020-01-10 (×4): qty 0.4

## 2020-01-10 MED ORDER — POTASSIUM CHLORIDE CRYS ER 20 MEQ PO TBCR
40.0000 meq | EXTENDED_RELEASE_TABLET | Freq: Once | ORAL | Status: AC
  Administered 2020-01-10: 40 meq via ORAL
  Filled 2020-01-10: qty 2

## 2020-01-10 MED ORDER — SODIUM CHLORIDE 0.9 % IV SOLN
100.0000 mg | Freq: Once | INTRAVENOUS | Status: AC
  Administered 2020-01-10: 100 mg via INTRAVENOUS
  Filled 2020-01-10: qty 100

## 2020-01-10 NOTE — ED Provider Notes (Cosign Needed)
[facility] EMERGENCY DEPARTMENT Provider Note   History    Chief Complaint  Patient presents with   Shortness of Breath   Weakness    Notably, much of this history is obtained from the patient who is awake, alert and oriented to person, place, time, and situation and able to provide an adequate account.  Olivia Marshall is a 69 y.o. female with MHx sig for fatigue, hyperlipidemia presents to ED with complaint of shortness of breath, cough, headache, fatigue in the setting of known COVID-19.  Patient states that she has been symptom flulike symptoms of the last 2 weeks.  Presented to an outside urgent care [facility]) for these complaints and tested positive for COVID-19.  She states that symptoms have been shared by multiple contacts.  States of the last week particularly she has had gradually worsening shortness of breath and headache.  Headache is frontal, constant.  No obvious worsening or alleviating factors.  The history is provided by the patient.  Shortness of Breath Severity:  Moderate Onset quality:  Gradual Duration:  1 week Timing:  Constant Progression:  Worsening Chronicity:  New Context comment:  Known COVID-19. Associated symptoms: cough, fever and headaches   Associated symptoms: no abdominal pain, no chest pain, no rash and no vomiting   Weakness Associated symptoms: cough, fever, headaches, myalgias and shortness of breath   Associated symptoms: no abdominal pain, no chest pain, no dizziness, no dysuria and no vomiting     Past Medical History:  Diagnosis Date   Family history of heart disease    Hyperlipidemia    Hypertension      History reviewed. No pertinent surgical history.   Family History  Problem Relation Age of Onset   Heart disease Mother    Transient ischemic attack Mother    Hyperlipidemia Mother    Hypertension Mother    ALS Father    Cancer Brother        Colon cancer   Hypertension Brother    Cancer  Maternal Grandmother        Colon cancer   Hyperlipidemia Maternal Grandfather    Tuberculosis Paternal Grandmother    Stroke Paternal Grandfather    Heart disease Paternal Grandfather     Social History   Tobacco Use   Smoking status: Never Smoker   Smokeless tobacco: Never Used  Substance Use Topics   Alcohol use: No     Review of Systems  Constitutional: Positive for chills, fatigue and fever.  HENT: Negative for facial swelling and voice change.   Eyes: Negative for redness and visual disturbance.  Respiratory: Positive for cough and shortness of breath.   Cardiovascular: Negative for chest pain and palpitations.  Gastrointestinal: Negative for abdominal pain and vomiting.  Genitourinary: Negative for difficulty urinating and dysuria.  Musculoskeletal: Positive for myalgias. Negative for gait problem and joint swelling.  Skin: Negative for rash and wound.  Neurological: Positive for weakness and headaches. Negative for dizziness.  Psychiatric/Behavioral: Negative for confusion and suicidal ideas.     Physical Exam    Vitals:   01/10/20 1303 01/10/20 1424  BP: 127/76 120/74  Pulse: 92 92  Resp: 18 (!) 24  Temp:    SpO2: 92% 91%    Physical Exam Constitutional:      General: She is not in acute distress.    Appearance: She is obese. She is ill-appearing.  HENT:     Head: Normocephalic and atraumatic.     Mouth/Throat:  Mouth: Mucous membranes are moist.     Pharynx: Oropharynx is clear.  Eyes:     General: No scleral icterus.    Pupils: Pupils are equal, round, and reactive to [place].  Cardiovascular:     Rate and Rhythm: Normal rate and regular rhythm.     Pulses: Normal pulses.  Pulmonary:     Effort: Pulmonary effort is normal. Tachypnea present. No respiratory distress.     Breath sounds: Rhonchi present.  Chest:     Chest wall: No tenderness.  Abdominal:     General: There is no distension.     Tenderness: There is no abdominal  tenderness.  Musculoskeletal:        General: No tenderness or deformity.     Cervical back: Normal range of motion and neck supple.     Right lower leg: No edema.     Left lower leg: No edema.  Neurological:     General: No focal deficit present.     Mental Status: She is alert. She is disoriented.     Cranial Nerves: No cranial nerve deficit.     Motor: No weakness.  Psychiatric:        Mood and Affect: Mood normal.        Behavior: Behavior normal.     ED Course    MDM Olivia Marshall is a 69 y.o.female w/ MHx and HPI as detailed above  Medical Decision Making: Possible differential diagnoses I considered included:  COVID-19, hypoxic respiratory failure, PE, ACS, pneumothorax, pneumonia, ICH  Workup/Thought Process:  Vitals, labs, EKGs, and imaging were reviewed by myself, and were significant for: Vitals:   01/10/20 1303 01/10/20 1424  BP: 127/76 120/74  Pulse: 92 92  Resp: 18 (!) 24  Temp:    SpO2: 92% 91%    Clinical Course as of Jan 10 1536  Mon Jan 10, 2020  1536 Potassium(!): 3.0 [JR]  1537 Temp: 97.9 F (36.6 C) [JR]  1537 Pulse Rate: 100 [JR]  1537 BP: 131/68 [JR]  1537 Resp: 20 [JR]  1537 SpO2: 91 % [JR]    Clinical Course User Index [JR] Loree Fee, MD    Patient presents to ED with multiple complaints related to known COVID-19 diagnosis.  Mostly she is short of breath and noted to be saturating 89 to 91% while at rest.  Patient ambulated with saturations dropping as low as 82%.  She is neurologically intact with gradual worsening of a frontal headache in the setting of a viral illness, have low suspicion for clinically significant headache syndromes I do not feel that CT head is warranted at this time.  We will obtain basic labs, chest x-ray, inflammatory markers in the setting of known COVID-19 anticipation of admission in the setting of hypoxia.  Low suspicion for PE with a gradual onset of symptoms though she does remain at slightly  higher risk in the setting of known COVID-19.  We will follow up D-dimer.  We will minister Toradol in the setting of normal kidney function, Tylenol for symptoms, will replete hypokalemia on triage labs.  Chest x-ray showed concern for possible right basilar infiltrate, will antibiosis with Rocephin and doxycycline for CAP though suspect that this is likely atelectasis in the setting of COVID-19.  D-dimer elevated at 1.87, CTA PE study performed showing no evidence of PE although multifocal pneumonia suggestive of COVID-19, incidental liver lesion.  At this time patient improved on 2 L of oxygen which is a new oxygen requirement  for her with improvement in her work of breathing.  I feel that she would benefit from admission for new oxygen requirement.  Internal medicine paged for mission and handout given to Dr. Cliffton Asters  The plan for this patient was discussed with Dr. Donnald Garre who voiced agreement and who oversaw evaluation and treatment of this patient.   Interventions/Procedures: Preprocedure   Impression: On re-evaluation patients VS were stable  I discussed all relevant finding and clinical impression to the patient. The patient expressed understanding of their condition as well as the disposition proposed and expressed agreement.  Clinical Impression: No diagnosis found.  Disposition: Admit to floor  Labs, studies and imaging reviewed by myself and considered in medical decision making if ordered. Imaging interpreted by radiology. Pt was discussed with my attending, Dr. Donnald Garre.  Signed: Loree Fee 01/10/2020 8:25 PM  Olivia Marshall was evaluated in Emergency Department on @HNDADMITDT @ for the symptoms described in the history of present illness. He/she was evaluated in the context of the global COVID-19 pandemic, which necessitated consideration that the patient might be at risk for infection with the SARS-CoV-2 virus that causes COVID-19. Institutional protocols and  algorithms that pertain to the evaluation of patients at risk for COVID-19 are in a state of rapid change based on information released by regulatory bodies including the CDC and federal and state organizations. These policies and algorithms were followed during the patient's care in the ED.    , MD 01/10/20 2026

## 2020-01-10 NOTE — H&P (Addendum)
Date: 01/10/2020               Patient Name:  Olivia Marshall MRN: [id number]  DOB: 1950/12/31 Age / Sex: 69 y.o., female   PCP: Assunta Found, MD         Medical Service: Internal Medicine Teaching Service         Attending Physician: Dr. Debe Coder, MD    First Contact: Dr. Alphonzo Severance Pager: [phone]  Second Contact: Dr. Lenward Chancellor Pager: [phone]       After Hours (After 5p/  First Contact Pager: [phone]  weekends / holidays): Second Contact Pager: [phone]   Chief Complaint: Shortness of breath  History of Present Illness: Ms. Olivia Marshall is a 69 year old female with past medical history of HTN and HLD who presented to [facility] on [facility] for evaluation of shortness of breath in the setting of recent COVID diagnosis.  Patient states that approximately two weeks ago, she presented to an urgent care in [facility] for evaluation of headache, cough, muscle aches and fatigue. Reportedly, at that time she tested negative for COVID, but positive for influenza. She was discharged home with plan to manage her symptoms at home. Her symptoms progressed over the next few days, so she returned to Urgent Care and tested positive for COVID. She was discharged home. She states that over the past week, her symptoms of cough and headache have progressed while her fatigue and muscle aches have continued. She states that her headache is severe, constant, located on top of her head, not alleviated or aggravated by anything.  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 She is not on oxygen at home and denies any underlying pulmonary disease.  ED Course: On arrival to the ED, she was initially hemodynamically stable, afebrile, but saturating at 91% on room air and down to 82% with ambulation. She was started on 2L [place] with improvement of her SpO2 to 98%. Labs confirmed that she was COVID  positive; CMP: K 3.0, CO2 20, anion gap 14, Glu 240; LDH 291; D-dimer 1.87; Fibrinogen 732; Lactic Acid 2.6; CRP 9.5. CXR showed subsegmental atelectasis in left lung base with atelectasis versus infiltrate of the right lung base; CTA Chest showed multifocal pneumonia. She received one dose of doxycycline and ceftriaxone. She was admitted to IMTS for further evaluation and management of her COVID pneumonia.  Meds:  No current facility-administered medications on file prior to encounter.   Current Outpatient Medications on File Prior to Encounter  Medication Sig Dispense Refill  . atorvastatin (LIPITOR) 80 MG tablet Take 0.5 tablets (40 mg total) by mouth daily. 90 tablet 3  . benazepril-hydrochlorthiazide (LOTENSIN HCT) 10-12.5 MG per tablet Take 1 tablet by mouth daily.    . calcium carbonate (OS-CAL) 600 MG TABS tablet Take 600 mg by mouth 2 (two) times daily with a meal.    . Coenzyme Q10 200 MG capsule Take 200 mg by mouth daily.    . folic acid (FOLVITE) 400 MCG tablet Take 400 mcg by mouth daily.    . Omega-3 Fatty Acids (FISH OIL) 1200 MG CAPS Take 4 capsules by mouth daily.    . predniSONE (STERAPRED UNI-PAK 21 TAB) 10 MG (21) TBPK tablet Take 10-60 mg by mouth See admin instructions.    . Vitamin Mixture (ESTER-C PO) Take 500 mg by mouth daily.  Allergies: Allergies as of 01/10/2020  . (Not on File)   Past Medical History:  Diagnosis Date  . Family history of heart disease   . Hyperlipidemia   . Hypertension    Family History:  Family History  Problem Relation Age of Onset  . Heart disease Mother   . Transient ischemic attack Mother   . Hyperlipidemia Mother   . Hypertension Mother   . ALS Father   . Cancer Brother        Colon cancer  . Hypertension Brother   . Cancer Maternal Grandmother        Colon cancer  . Hyperlipidemia Maternal Grandfather   . Tuberculosis Paternal Grandmother   . Stroke Paternal Grandfather   . Heart disease Paternal Grandfather     Social History:  Lives alone in [place] occasional alcohol use Denies current tobacco or recreational drug use  Review of Systems: A complete ROS was negative except as per HPI.  Physical Exam: Blood pressure 116/63, pulse (!) 59, temperature 97.9 F (36.6 C), temperature source Oral, resp. rate (!) 25, height 5\' 6"  (1.676 m), weight 90.7 kg, SpO2 98 %. Physical Exam Vitals and nursing note reviewed.  Constitutional:      Appearance: She is obese.  HENT:     Head: Normocephalic and atraumatic.  Eyes:     Extraocular Movements: Extraocular movements intact.     Pupils: Pupils are equal, round, and reactive to [place].  Cardiovascular:     Rate and Rhythm: Normal rate and regular rhythm.  Pulmonary:     Effort: Pulmonary effort is normal. Tachypnea present. No accessory muscle usage or respiratory distress.     Breath sounds: Rhonchi present.  Abdominal:     General: Bowel sounds are normal.     Palpations: Abdomen is soft.     Tenderness: There is no abdominal tenderness.  Musculoskeletal:        General: Normal range of motion.     Cervical back: Normal range of motion and neck supple.     Right lower leg: No edema.     Left lower leg: No edema.  Skin:    General: Skin is warm and dry.     Capillary Refill: Capillary refill takes less than 2 seconds.  Neurological:     General: No focal deficit present.     Mental Status: She is alert and oriented to person, place, and time.  Psychiatric:        Mood and Affect: Mood normal.        Behavior: Behavior normal.    EKG: personally reviewed my interpretation is normal sinus rhythm, nonspecific IVCD with LAD, LVH.  CXR: personally reviewed my interpretation is atelectasis of bilateral lung bases.   Assessment & Plan by Problem: Active Problems:   Pneumonia due to COVID-19 virus  #COVID Pneumonia Patient presenting following recent diagnosis of COVID-19 with worsening of her symptoms over the past week.  She has a new oxygen requirement with desaturation down to 82% with ambulation and 91% on room air at rest. She is currently saturating well and comfortable on 2L. CTA Chest revealed multifocal pneumonia. Given the progression of her infection and symptoms, she requires admission for management of her COVID pneumonia. We will initiate management of her condition with decadron and remdesivir and follow current COVID-19 admission protocols. -Decadron -Remdesivir -Airborne and contact precautions -Repeat CBC -Repeat CMP -Mg, Phos, ferritin, D-dimer, CRP  #HTN Holding home benazepril-HCTZ  #HLD Continue home lipitor  #  12 mm lesion in the right lobe of liver. Recommend outpatient MRI for follow up.  Code status: Full IVF: none DVT ppx: Lovenox Diet: Heart   Dispo: Admit patient to Inpatient with expected length of stay greater than 2 midnights.  Signed: Roylene Reason, MD 01/10/2020, 10:25 PM  Pager: [phone] After 5pm on weekdays and 1pm on weekends: On Call pager: [phone]

## 2020-01-11 DIAGNOSIS — Z79899 Other long term (current) drug therapy: Secondary | ICD-10-CM | POA: Diagnosis not present

## 2020-01-11 DIAGNOSIS — J101 Influenza due to other identified influenza virus with other respiratory manifestations: Secondary | ICD-10-CM | POA: Diagnosis not present

## 2020-01-11 DIAGNOSIS — J9811 Atelectasis: Secondary | ICD-10-CM | POA: Diagnosis not present

## 2020-01-11 DIAGNOSIS — E872 Acidosis, unspecified: Secondary | ICD-10-CM | POA: Diagnosis present

## 2020-01-11 DIAGNOSIS — U071 COVID-19: Secondary | ICD-10-CM | POA: Diagnosis not present

## 2020-01-11 DIAGNOSIS — J9601 Acute respiratory failure with hypoxia: Secondary | ICD-10-CM | POA: Diagnosis not present

## 2020-01-11 DIAGNOSIS — E669 Obesity, unspecified: Secondary | ICD-10-CM | POA: Diagnosis not present

## 2020-01-11 DIAGNOSIS — Z6834 Body mass index (BMI) 34.0-34.9, adult: Secondary | ICD-10-CM | POA: Diagnosis not present

## 2020-01-11 DIAGNOSIS — E785 Hyperlipidemia, unspecified: Secondary | ICD-10-CM | POA: Diagnosis not present

## 2020-01-11 DIAGNOSIS — I10 Essential (primary) hypertension: Secondary | ICD-10-CM

## 2020-01-11 DIAGNOSIS — R0602 Shortness of breath: Secondary | ICD-10-CM | POA: Diagnosis present

## 2020-01-11 DIAGNOSIS — Z8249 Family history of ischemic heart disease and other diseases of the circulatory system: Secondary | ICD-10-CM | POA: Diagnosis not present

## 2020-01-11 DIAGNOSIS — J1282 Pneumonia due to coronavirus disease 2019: Secondary | ICD-10-CM

## 2020-01-11 DIAGNOSIS — R739 Hyperglycemia, unspecified: Secondary | ICD-10-CM | POA: Diagnosis present

## 2020-01-11 DIAGNOSIS — Z83438 Family history of other disorder of lipoprotein metabolism and other lipidemia: Secondary | ICD-10-CM | POA: Diagnosis not present

## 2020-01-11 LAB — CBC WITH DIFFERENTIAL/PLATELET
Abs Immature Granulocytes: 0.02 10*3/uL (ref 0.00–0.07)
Basophils Absolute: 0 10*3/uL (ref 0.0–0.1)
Basophils Relative: 0 %
Eosinophils Absolute: 0 10*3/uL (ref 0.0–0.5)
Eosinophils Relative: 0 %
HCT: 38.5 % (ref 36.0–46.0)
Hemoglobin: 12.6 g/dL (ref 12.0–15.0)
Immature Granulocytes: 0 %
Lymphocytes Relative: 14 %
Lymphs Abs: 0.7 10*3/uL (ref 0.7–4.0)
MCH: 28.1 pg (ref 26.0–34.0)
MCHC: 32.7 g/dL (ref 30.0–36.0)
MCV: 85.9 fL (ref 80.0–100.0)
Monocytes Absolute: 0.3 10*3/uL (ref 0.1–1.0)
Monocytes Relative: 6 %
Neutro Abs: 4 10*3/uL (ref 1.7–7.7)
Neutrophils Relative %: 80 %
Platelets: 239 10*3/uL (ref 150–400)
RBC: 4.48 MIL/uL (ref 3.87–5.11)
RDW: 12.8 % (ref 11.5–15.5)
WBC: 5 10*3/uL (ref 4.0–10.5)
nRBC: 0 % (ref 0.0–0.2)

## 2020-01-11 LAB — COMPREHENSIVE METABOLIC PANEL
ALT: 28 U/L (ref 0–44)
AST: 38 U/L (ref 15–41)
Albumin: 2.9 g/dL — ABNORMAL LOW (ref 3.5–5.0)
Alkaline Phosphatase: 51 U/L (ref 38–126)
Anion gap: 10 (ref 5–15)
BUN: 16 mg/dL (ref 8–23)
CO2: 23 mmol/L (ref 22–32)
Calcium: 9 mg/dL (ref 8.9–10.3)
Chloride: 104 mmol/L (ref 98–111)
Creatinine, Ser: 0.82 mg/dL (ref 0.44–1.00)
GFR calc Af Amer: >60 mL/min (ref >60)
GFR calc non Af Amer: >60 mL/min (ref >60)
Glucose, Bld: 148 mg/dL — ABNORMAL HIGH (ref 70–99)
Potassium: 3.6 mmol/L (ref 3.5–5.1)
Sodium: 137 mmol/L (ref 135–145)
Total Bilirubin: 0.7 mg/dL (ref 0.3–1.2)
Total Protein: 6.2 g/dL — ABNORMAL LOW (ref 6.5–8.1)

## 2020-01-11 LAB — C-REACTIVE PROTEIN: CRP: 7.8 mg/dL — ABNORMAL HIGH (ref <1.0)

## 2020-01-11 LAB — URINALYSIS, ROUTINE W REFLEX MICROSCOPIC
Bilirubin Urine: NEGATIVE
Glucose, UA: NEGATIVE mg/dL
Hgb urine dipstick: NEGATIVE
Ketones, ur: NEGATIVE mg/dL
Leukocytes,Ua: NEGATIVE
Nitrite: NEGATIVE
Protein, ur: 30 mg/dL — AB
Specific Gravity, Urine: >1.046 — ABNORMAL HIGH (ref 1.005–1.030)
pH: 5 (ref 5.0–8.0)

## 2020-01-11 LAB — MAGNESIUM: Magnesium: 2.3 mg/dL (ref 1.7–2.4)

## 2020-01-11 LAB — HIV ANTIBODY (ROUTINE TESTING W REFLEX): HIV Screen 4th Generation wRfx: NONREACTIVE

## 2020-01-11 LAB — FERRITIN: Ferritin: 327 ng/mL — ABNORMAL HIGH (ref 11–307)

## 2020-01-11 LAB — D-DIMER, QUANTITATIVE: D-Dimer, Quant: 1.62 ug/mL-FEU — ABNORMAL HIGH (ref 0.00–0.50)

## 2020-01-11 LAB — PHOSPHORUS: Phosphorus: 2.1 mg/dL — ABNORMAL LOW (ref 2.5–4.6)

## 2020-01-11 MED ORDER — HYDROCOD POLST-CPM POLST ER 10-8 MG/5ML PO SUER
5.0000 mL | Freq: Two times a day (BID) | ORAL | Status: DC | PRN
  Administered 2020-01-11: 5 mL via ORAL
  Filled 2020-01-11: qty 5

## 2020-01-11 MED ORDER — GUAIFENESIN 100 MG/5ML PO SOLN
5.0000 mL | ORAL | Status: DC | PRN
  Administered 2020-01-12 – 2020-01-13 (×3): 100 mg via ORAL
  Filled 2020-01-11 (×4): qty 5

## 2020-01-11 MED ORDER — POTASSIUM CHLORIDE 10 MEQ/100ML IV SOLN
10.0000 meq | INTRAVENOUS | Status: AC
  Administered 2020-01-11 (×5): 10 meq via INTRAVENOUS
  Filled 2020-01-11 (×5): qty 100

## 2020-01-11 NOTE — Progress Notes (Signed)
  Date: 01/11/2020  Patient name: Olivia Marshall  Medical record number: [id number]  Date of birth: [birth year]        I have seen and evaluated this patient and I have discussed the plan of care with the house staff. Please see Dr. Murrell Redden note for complete details. I concur with her findings and plan.    Inez Catalina, MD 01/11/2020, 7:26 PM

## 2020-01-11 NOTE — Progress Notes (Signed)
° °  Subjective: No acute events overnight. Daughter was on speaker phone during our encounter. Olivia Marshall feels better compared to when she came in, but still gets quite short of breath with movement or talking. Prior to admission, she was having a lot of loose stools which seems to have resolved. She tolerated breakfast well. PO intake had been difficult for her the last several days.   Objective:  Vital signs in last 24 hours: Vitals:   01/11/20 1345 01/11/20 1415 01/11/20 1430 01/11/20 1500  BP: (!) 124/93 126/76 107/72 107/79  Pulse: 64 (!) 58 63 (!) 58  Resp: 15 (!) 30 19 16   Temp:      TempSrc:      SpO2: 94% 92% 91% 93%  Weight:      Height:       General: awake, alert, lying in bed in NAD Pulm: becomes dyspneic with conversation; lungs with some crackles at the bases, otherwise clear   Assessment/Plan:  Principal Problem:   Pneumonia due to COVID-19 virus Active Problems:   Essential hypertension   Hyperlipidemia   Lactic acidosis   Hyperglycemia  Olivia Marshall is a 69 y/o unvaccinated female with history of HTN and HLD who presented with progressive shortness of breath. She has been admitted with pneumonia secondary to COVID-19.  This is hospital day 1  COVID-19 pneumonia -saturating well on 3L [place] -day 2 of decadron and remdesivir  -inflammatory markers overall downtrending -IS and flutter valve -will add Robitussin and Tussionex prn cough  Prior to Admission Living Arrangement: home Anticipated Discharge Location: home Barriers to Discharge: continued clinical improvement   78, DO 01/11/2020, 5:20 PM Pager: [phone] After 5pm on weekdays and 1pm on weekends: On Call pager [phone]

## 2020-01-12 ENCOUNTER — Other Ambulatory Visit: Payer: Self-pay

## 2020-01-12 LAB — CBC WITH DIFFERENTIAL/PLATELET
Abs Immature Granulocytes: 0.04 10*3/uL (ref 0.00–0.07)
Basophils Absolute: 0 10*3/uL (ref 0.0–0.1)
Basophils Relative: 0 %
Eosinophils Absolute: 0 10*3/uL (ref 0.0–0.5)
Eosinophils Relative: 0 %
HCT: 39.2 % (ref 36.0–46.0)
Hemoglobin: 13 g/dL (ref 12.0–15.0)
Immature Granulocytes: 1 %
Lymphocytes Relative: 13 %
Lymphs Abs: 0.9 10*3/uL (ref 0.7–4.0)
MCH: 27.8 pg (ref 26.0–34.0)
MCHC: 33.2 g/dL (ref 30.0–36.0)
MCV: 83.9 fL (ref 80.0–100.0)
Monocytes Absolute: 0.4 10*3/uL (ref 0.1–1.0)
Monocytes Relative: 5 %
Neutro Abs: 5.4 10*3/uL (ref 1.7–7.7)
Neutrophils Relative %: 81 %
Platelets: 265 10*3/uL (ref 150–400)
RBC: 4.67 MIL/uL (ref 3.87–5.11)
RDW: 12.6 % (ref 11.5–15.5)
Smear Review: NORMAL
WBC: 6.8 10*3/uL (ref 4.0–10.5)
nRBC: 0 % (ref 0.0–0.2)

## 2020-01-12 LAB — COMPREHENSIVE METABOLIC PANEL
ALT: 27 U/L (ref 0–44)
AST: 29 U/L (ref 15–41)
Albumin: 2.7 g/dL — ABNORMAL LOW (ref 3.5–5.0)
Alkaline Phosphatase: 51 U/L (ref 38–126)
Anion gap: 9 (ref 5–15)
BUN: 15 mg/dL (ref 8–23)
CO2: 22 mmol/L (ref 22–32)
Calcium: 9.3 mg/dL (ref 8.9–10.3)
Chloride: 108 mmol/L (ref 98–111)
Creatinine, Ser: 0.65 mg/dL (ref 0.44–1.00)
GFR calc Af Amer: >60 mL/min (ref >60)
GFR calc non Af Amer: >60 mL/min (ref >60)
Glucose, Bld: 167 mg/dL — ABNORMAL HIGH (ref 70–99)
Potassium: 4.4 mmol/L (ref 3.5–5.1)
Sodium: 139 mmol/L (ref 135–145)
Total Bilirubin: 0.7 mg/dL (ref 0.3–1.2)
Total Protein: 5.8 g/dL — ABNORMAL LOW (ref 6.5–8.1)

## 2020-01-12 LAB — FERRITIN: Ferritin: 360 ng/mL — ABNORMAL HIGH (ref 11–307)

## 2020-01-12 LAB — MAGNESIUM: Magnesium: 2.2 mg/dL (ref 1.7–2.4)

## 2020-01-12 LAB — HEMOGLOBIN A1C
Hgb A1c MFr Bld: 6.7 % — ABNORMAL HIGH (ref 4.8–5.6)
Mean Plasma Glucose: 146 mg/dL

## 2020-01-12 LAB — D-DIMER, QUANTITATIVE: D-Dimer, Quant: 1.02 ug/mL-FEU — ABNORMAL HIGH (ref 0.00–0.50)

## 2020-01-12 LAB — C-REACTIVE PROTEIN: CRP: 2.9 mg/dL — ABNORMAL HIGH (ref <1.0)

## 2020-01-12 LAB — PHOSPHORUS: Phosphorus: 2.6 mg/dL (ref 2.5–4.6)

## 2020-01-12 MED ORDER — ORAL CARE MOUTH RINSE
15.0000 mL | Freq: Two times a day (BID) | OROMUCOSAL | Status: DC
  Administered 2020-01-12 – 2020-01-14 (×5): 15 mL via OROMUCOSAL

## 2020-01-12 NOTE — Progress Notes (Signed)
Internal Medicine Teaching Service Note  Subjective: Patient reports breathing is improved and that she is taking deeper breaths today.  She continues to need oxygen.    Objective Today's Vitals   01/12/20 0801 01/12/20 1200 01/12/20 1219 01/12/20 1620  BP: 110/80  110/62 112/62  Pulse: 60  66 (!) 57  Resp: 16  15 19   Temp: 97.7 F (36.5 C)  (!) 97.5 F (36.4 C) 97.7 F (36.5 C)  TempSrc: Oral  Oral Oral  SpO2: 93%  94% 92%  Weight:      Height:      PainSc:  0-No pain     Body mass index is 34.52 kg/m.   General: Awake, alert, no distress Eyes: anicteric sclerae CV: Rhythm is normal on the monitor Pulm: Breathing is mildly uncomfortable for her, coughing with deep breathing, no audible wheezing Psych: Pleasant, normal mood.   Glucose 167 CRP 2.9 (down from 7.8) Ferritin 360 WBC 6.8 with a normal differential Ddimer 1.02 (down from 1.6)  Assessment:  Olivia Marshall is a pleasant 69 year old woman who is here with COVID 19 pneumonia.   Plan  Covid 19 Pneumonia Continues to need oxygen.  Inflammatory markers are trending down.  We will continue her on remdesivir and dexamethasone and trend markers.  We will continue her on oxygen.  She will ambulate, hopefully tomorrow, and see how she does.  Continue monitoring on telemetry.  Continue IS and flutter valve.   HTN Her blood pressure remains normotensive so we are holding her home medications.    Anticipate discharge when she has continued to improve.    78, MD  After 5pm on weekdays and 1pm on weekends: On Call pager [phone]

## 2020-01-13 LAB — COMPREHENSIVE METABOLIC PANEL
ALT: 26 U/L (ref 0–44)
AST: 26 U/L (ref 15–41)
Albumin: 2.6 g/dL — ABNORMAL LOW (ref 3.5–5.0)
Alkaline Phosphatase: 55 U/L (ref 38–126)
Anion gap: 9 (ref 5–15)
BUN: 17 mg/dL (ref 8–23)
CO2: 22 mmol/L (ref 22–32)
Calcium: 9.8 mg/dL (ref 8.9–10.3)
Chloride: 108 mmol/L (ref 98–111)
Creatinine, Ser: 0.73 mg/dL (ref 0.44–1.00)
GFR calc Af Amer: >60 mL/min (ref >60)
GFR calc non Af Amer: >60 mL/min (ref >60)
Glucose, Bld: 189 mg/dL — ABNORMAL HIGH (ref 70–99)
Potassium: 4.6 mmol/L (ref 3.5–5.1)
Sodium: 139 mmol/L (ref 135–145)
Total Bilirubin: 0.4 mg/dL (ref 0.3–1.2)
Total Protein: 5.6 g/dL — ABNORMAL LOW (ref 6.5–8.1)

## 2020-01-13 LAB — CBC WITH DIFFERENTIAL/PLATELET
Abs Immature Granulocytes: 0.03 10*3/uL (ref 0.00–0.07)
Basophils Absolute: 0 10*3/uL (ref 0.0–0.1)
Basophils Relative: 0 %
Eosinophils Absolute: 0 10*3/uL (ref 0.0–0.5)
Eosinophils Relative: 0 %
HCT: 39.7 % (ref 36.0–46.0)
Hemoglobin: 12.9 g/dL (ref 12.0–15.0)
Immature Granulocytes: 0 %
Lymphocytes Relative: 11 %
Lymphs Abs: 0.9 10*3/uL (ref 0.7–4.0)
MCH: 27 pg (ref 26.0–34.0)
MCHC: 32.5 g/dL (ref 30.0–36.0)
MCV: 83.1 fL (ref 80.0–100.0)
Monocytes Absolute: 0.3 10*3/uL (ref 0.1–1.0)
Monocytes Relative: 4 %
Neutro Abs: 7.1 10*3/uL (ref 1.7–7.7)
Neutrophils Relative %: 85 %
Platelets: 289 10*3/uL (ref 150–400)
RBC: 4.78 MIL/uL (ref 3.87–5.11)
RDW: 12.6 % (ref 11.5–15.5)
WBC: 8.4 10*3/uL (ref 4.0–10.5)
nRBC: 0 % (ref 0.0–0.2)

## 2020-01-13 LAB — MAGNESIUM: Magnesium: 2.1 mg/dL (ref 1.7–2.4)

## 2020-01-13 LAB — FERRITIN: Ferritin: 235 ng/mL (ref 11–307)

## 2020-01-13 LAB — C-REACTIVE PROTEIN: CRP: 1.4 mg/dL — ABNORMAL HIGH (ref <1.0)

## 2020-01-13 LAB — D-DIMER, QUANTITATIVE: D-Dimer, Quant: 0.79 ug/mL-FEU — ABNORMAL HIGH (ref 0.00–0.50)

## 2020-01-13 LAB — PHOSPHORUS: Phosphorus: 3.3 mg/dL (ref 2.5–4.6)

## 2020-01-13 MED ORDER — HYDROCOD POLST-CPM POLST ER 10-8 MG/5ML PO SUER: 5.0000 mL | Freq: Two times a day (BID) | ORAL | Status: DC | PRN

## 2020-01-13 NOTE — Progress Notes (Signed)
° °  Subjective:   No acute events overnight.   Olivia Marshall reports her breathing continues to improve and she has more energy. Is pleasantly surprised to find out that she is maintaining her oxygen saturations on 1-2 L [place]. Reports persistence of diarrhea, however notes that it has gotten better.  Objective:  Vital signs in last 24 hours: Vitals:   01/12/20 2132 01/13/20 0000 01/13/20 0505 01/13/20 1322  BP:  (!) 101/46 111/73 128/77  Pulse: (!) 53 (!) 45 63 62  Resp: (!) 25 20 18 16   Temp:   97.9 F (36.6 C) 98.1 F (36.7 C)  TempSrc:   Oral Oral  SpO2: 94% 94% 95% 92%  Weight:      Height:       General: awake, alert, sitting upright in her chair in no acute distress Pulm: normal work of breathing, coughs some with deep breathing, lungs with some crackles at the bases otherwise clear, satting ~94% on 1L [place]  Assessment/Plan:  Principal Problem:   Pneumonia due to COVID-19 virus Active Problems:   Essential hypertension   Hyperlipidemia   Lactic acidosis   Hyperglycemia  Olivia Marshall is a 69 y/o unvaccinated womanwith history of HTN and HLD who presented with progressive shortness of breath. She has been admitted with pneumonia secondary to COVID-19.  This is hospital day 3.   COVID-19 pneumonia - saturating well on 1-2L [place] - day 4 of decadron and remdesivir  - inflammatory markers downtrending - IS and flutter valve - Robitussin and Tussionex prn - PT consulted - ambulate with pulse ox  HTN Her blood pressure remains normotensive so we are holding her home medications.     78, MD 01/13/2020, 1:38 PM Pager: [phone] After 5pm on weekdays and 1pm on weekends: On Call pager [phone]

## 2020-01-14 LAB — COMPREHENSIVE METABOLIC PANEL
ALT: 32 U/L (ref 0–44)
AST: 31 U/L (ref 15–41)
Albumin: 2.7 g/dL — ABNORMAL LOW (ref 3.5–5.0)
Alkaline Phosphatase: 59 U/L (ref 38–126)
Anion gap: 8 (ref 5–15)
BUN: 15 mg/dL (ref 8–23)
CO2: 23 mmol/L (ref 22–32)
Calcium: 9.4 mg/dL (ref 8.9–10.3)
Chloride: 110 mmol/L (ref 98–111)
Creatinine, Ser: 0.74 mg/dL (ref 0.44–1.00)
GFR calc Af Amer: >60 mL/min (ref >60)
GFR calc non Af Amer: >60 mL/min (ref >60)
Glucose, Bld: 193 mg/dL — ABNORMAL HIGH (ref 70–99)
Potassium: 4.4 mmol/L (ref 3.5–5.1)
Sodium: 141 mmol/L (ref 135–145)
Total Bilirubin: 0.3 mg/dL (ref 0.3–1.2)
Total Protein: 5.6 g/dL — ABNORMAL LOW (ref 6.5–8.1)

## 2020-01-14 LAB — CBC WITH DIFFERENTIAL/PLATELET
Abs Immature Granulocytes: 0.09 10*3/uL — ABNORMAL HIGH (ref 0.00–0.07)
Basophils Absolute: 0 10*3/uL (ref 0.0–0.1)
Basophils Relative: 0 %
Eosinophils Absolute: 0 10*3/uL (ref 0.0–0.5)
Eosinophils Relative: 0 %
HCT: 39.9 % (ref 36.0–46.0)
Hemoglobin: 13 g/dL (ref 12.0–15.0)
Immature Granulocytes: 1 %
Lymphocytes Relative: 9 %
Lymphs Abs: 0.9 10*3/uL (ref 0.7–4.0)
MCH: 27.1 pg (ref 26.0–34.0)
MCHC: 32.6 g/dL (ref 30.0–36.0)
MCV: 83.1 fL (ref 80.0–100.0)
Monocytes Absolute: 0.4 10*3/uL (ref 0.1–1.0)
Monocytes Relative: 4 %
Neutro Abs: 8.6 10*3/uL — ABNORMAL HIGH (ref 1.7–7.7)
Neutrophils Relative %: 86 %
Platelets: 339 10*3/uL (ref 150–400)
RBC: 4.8 MIL/uL (ref 3.87–5.11)
RDW: 12.8 % (ref 11.5–15.5)
WBC: 10 10*3/uL (ref 4.0–10.5)
nRBC: 0 % (ref 0.0–0.2)

## 2020-01-14 LAB — PHOSPHORUS: Phosphorus: 3.2 mg/dL (ref 2.5–4.6)

## 2020-01-14 LAB — MAGNESIUM: Magnesium: 2.1 mg/dL (ref 1.7–2.4)

## 2020-01-14 LAB — D-DIMER, QUANTITATIVE: D-Dimer, Quant: 0.77 ug/mL-FEU — ABNORMAL HIGH (ref 0.00–0.50)

## 2020-01-14 LAB — FERRITIN: Ferritin: 218 ng/mL (ref 11–307)

## 2020-01-14 LAB — C-REACTIVE PROTEIN: CRP: 1.1 mg/dL — ABNORMAL HIGH (ref <1.0)

## 2020-01-14 NOTE — TOC Transition Note (Signed)
Transition of Care [facility]) - CM/SW Discharge Note   Patient Details  Name: Olivia Marshall MRN: [id number] Date of Birth: 23-Nov-1950  Transition of Care [facility]) CM/SW Contact:  Lockie Pares, RN Phone Number: 01/14/2020, 1:25 PM   Clinical Narrative:    Oxygen levels only decreased to 91% when ambulating, does not need oxygen at home. NO other needs identified. MD appointment made for follow up   Final next level of care: Home/Self Care Barriers to Discharge: No Barriers Identified   Patient Goals and CMS Choice Patient states their goals for this hospitalization and ongoing recovery are:: HOme      Discharge Placement                       Discharge Plan and Services                                     Social Determinants of Health (SDOH) Interventions     Readmission Risk Interventions No flowsheet data found.

## 2020-01-14 NOTE — Evaluation (Signed)
Physical Therapy Evaluation & Discharge Patient Details Name: Olivia Marshall MRN: [id number] DOB: July 19, 1950 Today's Date: 01/14/2020   History of Present Illness  Pt is a 69 y.o. female admitted 01/10/20 with progressive SOB; pt unvaccinated. Workup for COVID-19 PNA. PMH includes HTN, HLD.    Clinical Impression  Patient evaluated by Physical Therapy with no further acute PT needs identified. PTA, pt independent, lives alone, active. Education re: current conditions, O2 needs, activity recommendations, positioning, therex, energy conservation, and importance of mobility. Today, pt mobilizing and performing ADL tasks independently. SpO2 88-92% on RA with activity. All education has been completed and the patient has no further questions. Acute PT is signing off. Thank you for this referral.     Follow Up Recommendations No PT follow up    Equipment Recommendations  None recommended by PT    Recommendations for Other Services       Precautions / Restrictions Precautions Precautions: None Restrictions Weight Bearing Restrictions: No      Mobility  Bed Mobility               General bed mobility comments: Received in bathroom  Transfers Overall transfer level: Independent Equipment used: None                Ambulation/Gait Ambulation/Gait assistance: Independent Gait Distance (Feet): 40 Feet Assistive device: None Gait Pattern/deviations: WFL(Within Functional Limits)     General Gait Details: Slow, steady gait without DME, although increased SOB after changing clothes and using bathroom; SpO2 88-92% on RA  Stairs            Wheelchair Mobility    Modified Rankin (Stroke Patients Only)       Balance Overall balance assessment: No apparent balance deficits (not formally assessed)                                           Pertinent Vitals/Pain Pain Assessment: No/denies pain    Home Living Family/patient expects to be  discharged to:: Private residence Living Arrangements: Alone Available Help at Discharge: Family;Friend(s);Available PRN/intermittently Type of Home: House Home Access: Stairs to enter   [employer] of Steps: 3 Home Layout: One level   Additional Comments: Has access to shower seat    Prior Function Level of Independence: Independent         Comments: 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, 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, so now she "enjoys the quiet"     Hand Dominance        Extremity/Trunk Assessment   Upper Extremity Assessment Upper Extremity Assessment: Overall WFL for tasks assessed    Lower Extremity Assessment Lower Extremity Assessment: Overall WFL for tasks assessed    Cervical / Trunk Assessment Cervical / Trunk Assessment: Normal  Communication   Communication: No difficulties  Cognition Arousal/Alertness: Awake/alert Behavior During Therapy: WFL for tasks assessed/performed Overall Cognitive Status: Within Functional Limits for tasks assessed                                        General Comments General comments (skin integrity, edema, etc.): Educ re: activity recommendations, energy conservation strategies (handout provided), SpO2 monitoring/pulse ox    Exercises     Assessment/Plan    PT Assessment Patent does not need any further PT services  PT Problem List  PT Treatment Interventions      PT Goals (Current goals can be found in the Care Plan section)  Acute Rehab PT Goals PT Goal Formulation: All assessment and education complete, DC therapy    Frequency     Barriers to discharge        Co-evaluation               AM-PAC PT "6 Clicks" Mobility  Outcome Measure Help needed turning from your back to your side while in a flat bed without using bedrails?: None Help needed moving from lying on your back to sitting on the side of a flat bed without using bedrails?: None Help needed moving to and from a bed  to a chair (including a wheelchair)?: None Help needed standing up from a chair using your arms (e.g., wheelchair or bedside chair)?: None Help needed to walk in hospital room?: None Help needed climbing 3-5 steps with a railing? : None 6 Click Score: 24    End of Session   Activity Tolerance: Patient tolerated treatment well Patient left: in chair;with call bell/phone within reach Nurse Communication: Mobility status PT Visit Diagnosis: Other abnormalities of gait and mobility (R26.89)    Time: 8469-6295 PT Time Calculation (min) (ACUTE ONLY): 18 min   Charges:   PT Evaluation $PT Eval Moderate Complexity: 1 Mod        Ina Homes, PT, DPT Acute Rehabilitation Services  Pager [phone] Office [phone]  Malachy Chamber 01/14/2020, 5:20 PM

## 2020-01-14 NOTE — Progress Notes (Signed)
° °  Subjective:   No acute events overnight.   Olivia Marshall states she is feeling "great" this morning. She has noticed significant improvement in her energy and cough. Diarrhea has improved. She is eating and drinking well. Reports she is looking forward to recovering at home.  Objective:  Vital signs in last 24 hours: Vitals:   01/13/20 1322 01/13/20 2100 01/13/20 2124 01/14/20 0501  BP: 128/77  111/66 (!) 113/52  Pulse: 62 66 65 66  Resp: 16 20 20 18   Temp: 98.1 F (36.7 C) 97.7 F (36.5 C) 97.7 F (36.5 C) 97.6 F (36.4 C)  TempSrc: Oral  Oral Oral  SpO2: 92% 92% 94% 94%  Weight:    95.7 kg  Height:       General: awake, alert, sitting upright in her chair in no acute distress Pulm: breathing comfortably on room air, able to take deeper breaths, lungs with some crackles at the bases otherwise clear  Assessment/Plan:  Principal Problem:   Pneumonia due to COVID-19 virus Active Problems:   Essential hypertension   Hyperlipidemia   Lactic acidosis   Hyperglycemia  Olivia Marshall is a 68 y/o unvaccinated womanwith history of HTN and HLD who presented with progressive shortness of breath. She has been admitted with pneumonia secondary to COVID-19.  This is hospital day 4. Planning for discharge home today pending ambulation with pulse ox to assess for home oxygen need.  COVID-19 pneumonia - saturating well on room air to 1 L [place] - day 5 of decadron and remdesivir  - inflammatory markers have downtrended - IS and flutter valve - Robitussin and Tussionex prn - ambulate with pulse ox  HTN Her blood pressure remains normotensive so we are holding her home medications. Will resume at discharge.   78, MD 01/14/2020, 7:10 AM Pager: [phone] After 5pm on weekdays and 1pm on weekends: On Call pager [phone]

## 2020-01-14 NOTE — Discharge Summary (Signed)
Name: Olivia Marshall MRN: [id number] DOB: Oct 27, 1950 69 y.o. PCP: Assunta Found, MD  Date of Admission: 01/10/2020 12:02 PM Date of Discharge: 01/14/20 Attending Physician: Inez Catalina, MD  Discharge Diagnosis:  1. Pneumonia secondary to COVID-19  Discharge Medications: Allergies as of 01/14/2020   No Known Allergies     Medication List    STOP taking these medications   predniSONE 10 MG (21) Tbpk tablet Commonly known as: STERAPRED UNI-PAK 21 TAB     TAKE these medications   atorvastatin 80 MG tablet Commonly known as: LIPITOR Take 0.5 tablets (40 mg total) by mouth daily. What changed: when to take this   benazepril-hydrochlorthiazide 10-12.5 MG tablet Commonly known as: LOTENSIN HCT Take 1 tablet by mouth at bedtime.       Disposition and follow-up:   Olivia Marshall was discharged from [facility] in Good condition.  At the hospital follow up visit please address:  1.    - general recovery from COVID-19 pneumonia - she will be eligible to receive a COVID vaccine 21 days after the onset of her symptoms  2.  Labs / imaging needed at time of follow-up:   none  3.  Pending labs/ test needing follow-up:   none  Follow-up Appointments:  Follow-up Information    Assunta Found, MD. Go on 01/18/2020.   Specialty: Family Medicine Why: 1;00 TELE VISIT  may be earlier  will text a link to pnone Contact information: [address] [place] [address] [phone]               Hospital Course by problem list:  Olivia Marshall is a 69 year old unvaccinated woman with history of HTN and HLD who presented to [facility] on 01/10/20 with progressive shortness of breath. She was admitted with pneumonia secondary to COVID-19.  COVID-19 pneumonia Presented with 1-week worsening of cough, headache, fatigue after testing positive for COVID. Had tested positive for influenza a few days prior to testing positive for COVID. CXR showed  subsegmental atelectasis in left lung base with atelectasis versus infiltrate of the right lung base; CTA Chest showed multifocal pneumonia. She received one dose of doxycycline and ceftriaxone. After admission to our service, she was treated with 5 days of remdesivir and decadron. She initially required supplemental oxygen however was weaned to room air by discharge.  - discharged home with instructions to continue her quarantine for 21 days after symptom onset, after which time she will also be eligible to receive a COVID vaccine of her choosing.  Discharge Vitals:   BP (!) 113/52 (BP Location: Right Arm)   Pulse 66   Temp 97.6 F (36.4 C) (Oral)   Resp 18   Ht 5\' 6"  (1.676 m)   Wt 95.7 kg   SpO2 94%   BMI 34.05 kg/m   Pertinent Labs, Studies, and Procedures:   01/10/20 PORTABLE CHEST 1 VIEW  COMPARISON:  Portable exam 1604 hours without priors for comparison  FINDINGS: Normal heart size, mediastinal contours, and pulmonary vascularity.  Subsegmental atelectasis LEFT base.  Hazy infiltrate versus atelectasis at lower RIGHT lung.  Upper lungs clear.  No pleural effusion or pneumothorax.  Bones demineralized.  IMPRESSION: Subsegmental atelectasis LEFT base with additional atelectasis versus infiltrate at RIGHT base.  Electronically Signed   By: 01/12/20 M.D.   On: 01/10/2020 16:23  01/10/20 CT ANGIOGRAPHY CHEST WITH CONTRAST  COMPARISON:  Chest radiograph dated 01/10/2020  FINDINGS: Cardiovascular: There is no cardiomegaly or pericardial effusion.  The thoracic aorta is unremarkable. There is no CT evidence of pulmonary embolism.  Mediastinum/Nodes: Mild bilateral hilar adenopathy. The esophagus is grossly unremarkable. No mediastinal fluid collection.  Lungs/Pleura: Bilateral peripheral and subpleural streaky and patchy ground-glass opacities consistent with multifocal pneumonia and in keeping with COVID-19. Clinical correlation and follow-up  to resolution recommended. There is no pleural effusion or pneumothorax. The central airways are patent.  Upper Abdomen: Layering gallstones. A 12 mm hypodense lesion in the right lobe of liver (116/5) is not characterized.  Musculoskeletal: No chest wall abnormality. No acute or significant osseous findings.  Review of the MIP images confirms the above findings.  IMPRESSION: 1. No CT evidence of pulmonary embolism. 2. Multifocal pneumonia and in keeping with COVID-19. Clinical correlation and follow-up to resolution recommended. 3. Mild bilateral hilar adenopathy, likely reactive. 4. Cholelithiasis. 5. A 12 mm indeterminate hypodense lesion in the right lobe of liver. This can be better evaluated with MRI without and with contrast on a nonemergent/outpatient basis.   Electronically Signed   By: Elgie Collard M.D.   On: 01/10/2020 19:28  Discharge Instructions: Discharge Instructions    Discharge instructions   Complete by: As directed    Olivia Marshall, it was a pleasure taking care of you. You were treated for COVID-19 pneumonia. You were treated with 5 days of Remdesivir and steroids. You also received supplemental oxygen but do not need to continue supplemental oxygen at home. You should continue to quarantine until 21 days after your symptoms began. After that time, you will be eligible to get your COVID vaccine. We recommend you follow-up with your primary care physician in the next 1-2 weeks, either virtually if you are still quarantining, or in person.  Take care!   Increase activity slowly   Complete by: As directed       Signed: Alphonzo Severance, MD 01/14/2020, 3:03 PM   Pager: [phone]

## 2020-01-14 NOTE — Progress Notes (Signed)
SATURATION QUALIFICATIONS: (This note is used to comply with regulatory documentation for home oxygen)  Patient Saturations on Room Air at Rest = 93%  Patient Saturations on Room Air while Ambulating = 91%  Patient Saturations on  Liters of oxygen while Ambulating = %  Please briefly explain why patient needs home oxygen:  Patient does not have a need for home oxygen. Saturations never fall below 91% while ambulating.

## 2020-01-14 NOTE — TOC Initial Note (Addendum)
Transition of Care [facility]) - Initial/Assessment Note    Patient Details  Name: Olivia Marshall MRN: [id number] Date of Birth: 11-19-1950  Transition of Care [facility]) CM/SW Contact:    Lockie Pares, RN Phone Number: 01/14/2020, 9:39 AM  Clinical Narrative:                 Patient presents with COVID pneumonia symptoms began 2 weeks ago, originally tested positive for flu and negative for COVID, then a few days later tested positive for COVID at urgent care, symptoms progressed with increased Shortness of Breath and desaturation on ambulation here at ED She will likely need oxygen set up at home. Will follow for needs . Asked RN to obtain oxygen qualifications  Expected Discharge Plan: Home/Self Care Barriers to Discharge: Continued Medical Work up   Patient Goals and CMS Choice        Expected Discharge Plan and Services Expected Discharge Plan: Home/Self Care       Living arrangements for the past 2 months: Single Family Home                                      Prior Living Arrangements/Services Living arrangements for the past 2 months: Single Family Home Lives with:: Self Patient language and need for interpreter reviewed:: Yes        Need for Family Participation in Patient Care: Yes (Comment) Care giver support system in place?: Yes (comment)   Criminal Activity/Legal Involvement Pertinent to Current Situation/Hospitalization: No - Comment as needed  Activities of Daily Living Home Assistive Devices/Equipment: Blood pressure cuff, CBG Meter, Cane (specify quad or straight), Bedside commode/3-in-1, Eyeglasses, Grab bars in shower, Dentures (specify type), Shower chair without back, Walker (specify type) (Straight cane. Partial upper. Front-wheeled. walker.) ADL Screening (condition at time of admission) Patient's cognitive ability adequate to safely complete daily activities?: Yes Is the patient deaf or have difficulty hearing?: No Does the patient have  difficulty seeing, even when wearing glasses/contacts?: No Does the patient have difficulty concentrating, remembering, or making decisions?: No Patient able to express need for assistance with ADLs?: Yes Does the patient have difficulty dressing or bathing?: No Independently performs ADLs?: No Communication: Independent Dressing (OT): Independent Grooming: Independent Feeding: Independent Bathing: Needs assistance Is this a change from baseline?: Change from baseline, expected to last <3 days Toileting: Needs assistance Is this a change from baseline?: Change from baseline, expected to last <3 days In/Out Bed: Needs assistance Is this a change from baseline?: Change from baseline, expected to last <3 days Walks in Home: Independent Does the patient have difficulty walking or climbing stairs?: Yes Weakness of Legs: Both Weakness of Arms/Hands: None  Permission Sought/Granted      Share Information with NAME: Brother           Emotional Assessment       Orientation: : Oriented to Self, Oriented to Place, Oriented to  Time, Oriented to Situation Alcohol / Substance Use: Not Applicable Psych Involvement: No (comment)  Admission diagnosis:  Acute respiratory failure with hypoxia (HCC) [J96.01] Pneumonia due to COVID-19 virus [U07.1, J12.82] COVID-19 [U07.1] Patient Active Problem List   Diagnosis Date Noted  . Lactic acidosis 01/11/2020  . Hyperglycemia 01/11/2020  . Acute respiratory failure with hypoxia (HCC)   . Pneumonia due to COVID-19 virus 01/10/2020  . Left bundle branch block 03/24/2019  . Essential hypertension 08/20/2013  . Hyperlipidemia  08/20/2013  . Family history of coronary artery disease 08/20/2013   PCP:  Assunta Found, MD Pharmacy:   [phone] - [place] - [address] [place] [address] Phone: [phone] Fax: [phone]  [facility] [address] [place] - [address] [place] [address] [place]  [place] [address] Phone: [phone] Fax: [phone]     Social Determinants of Health (SDOH) Interventions    Readmission Risk Interventions No flowsheet data found.

## 2020-01-15 LAB — CULTURE, BLOOD (ROUTINE X 2)
Culture: NO GROWTH
Culture: NO GROWTH
Special Requests: ADEQUATE
Special Requests: ADEQUATE

## 2020-01-18 DIAGNOSIS — U071 COVID-19: Secondary | ICD-10-CM | POA: Diagnosis not present

## 2020-01-18 DIAGNOSIS — Z681 Body mass index (BMI) 19 or less, adult: Secondary | ICD-10-CM | POA: Diagnosis not present

## 2020-05-09 ENCOUNTER — Telehealth: Payer: Self-pay | Admitting: Cardiovascular Disease

## 2020-05-09 DIAGNOSIS — R69 Illness, unspecified: Secondary | ICD-10-CM | POA: Diagnosis not present

## 2020-05-09 NOTE — Telephone Encounter (Signed)
Spoke with patient about follow up appt, she informed me that she has moved to [place].

## 2020-05-15 DIAGNOSIS — Z20822 Contact with and (suspected) exposure to covid-19: Secondary | ICD-10-CM | POA: Diagnosis not present

## 2020-05-15 DIAGNOSIS — J069 Acute upper respiratory infection, unspecified: Secondary | ICD-10-CM | POA: Diagnosis not present

## 2020-05-15 DIAGNOSIS — B9689 Other specified bacterial agents as the cause of diseases classified elsewhere: Secondary | ICD-10-CM | POA: Diagnosis not present

## 2020-12-08 IMAGING — CT CT ANGIO CHEST
2 of 6 series · 18 of 36 positions shown · IV contrast (omnipaque)
Comparison: Chest radiograph dated 01/10/2020

CLINICAL DATA: 69-year-old female with positive FQMJT-MQ and
increased shortness of breath.

EXAM:
CT ANGIOGRAPHY CHEST WITH CONTRAST
TECHNIQUE: Multidetector CT imaging of the chest was performed using the
standard protocol during bolus administration of intravenous
contrast. Multiplanar CT image reconstructions and MIPs were
obtained to evaluate the vascular anatomy.
CONTRAST:  60mL OMNIPAQUE IOHEXOL 350 MG/ML SOLN

[Series 7: pe thins · axial · 0.73mm/px · z∈[-200,+17]mm · 17 of 346 slices shown]
[im 18/346  lung]
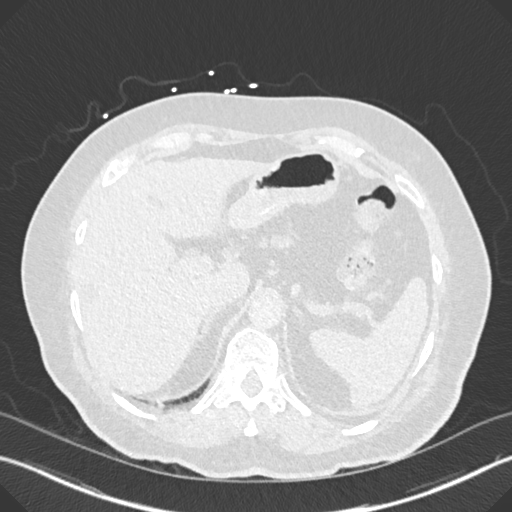
[im 35/346  mediastinal]
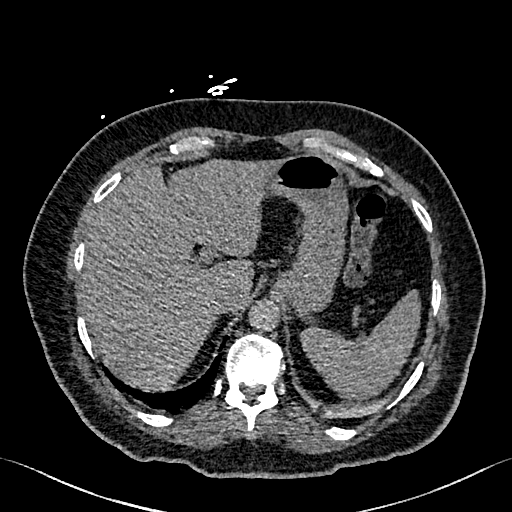
[im 52/346  lung]
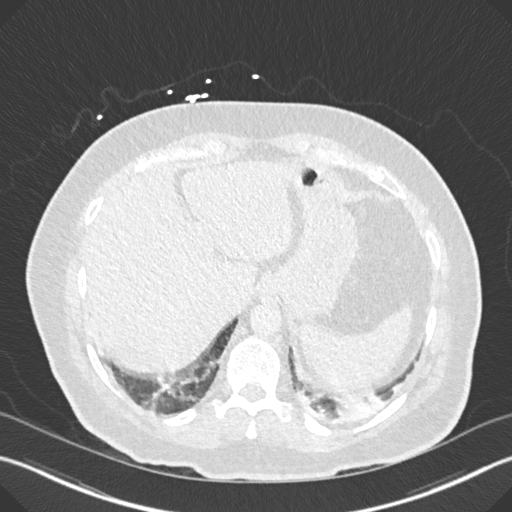
[im 70/346  mediastinal]
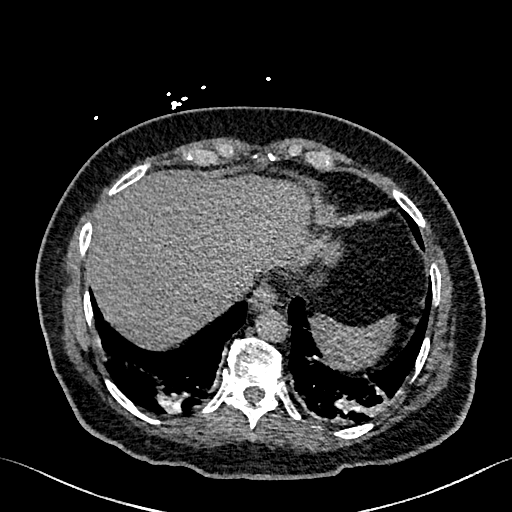
[im 104/346  lung]
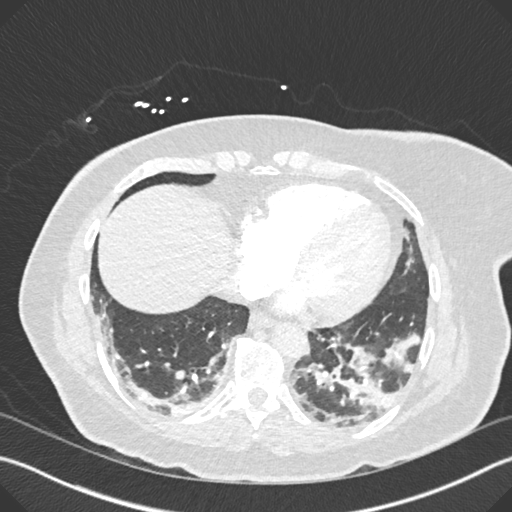
[im 121/346  mediastinal]
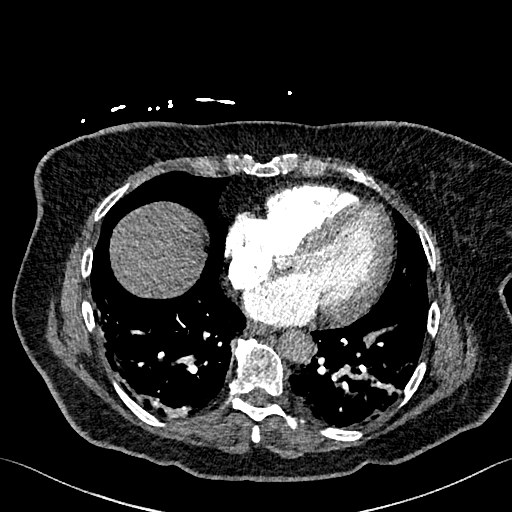
[im 139/346  lung]
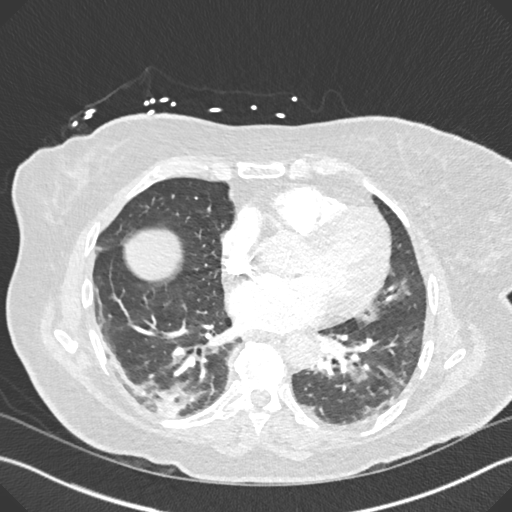
[im 156/346  mediastinal]
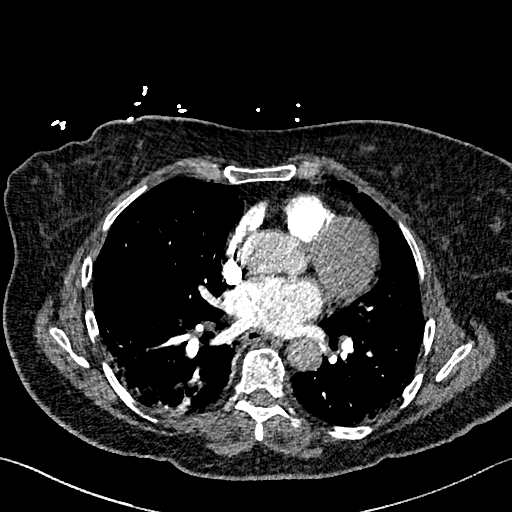
[im 173/346  lung]
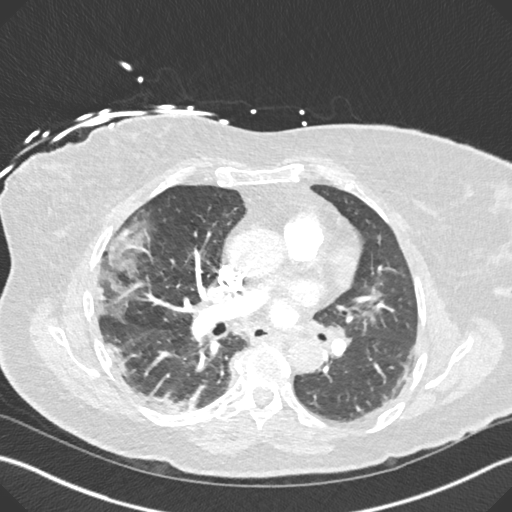
[im 190/346  mediastinal]
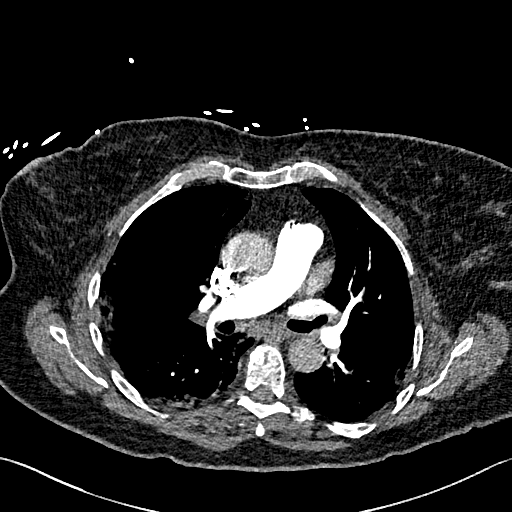
[im 208/346  lung]
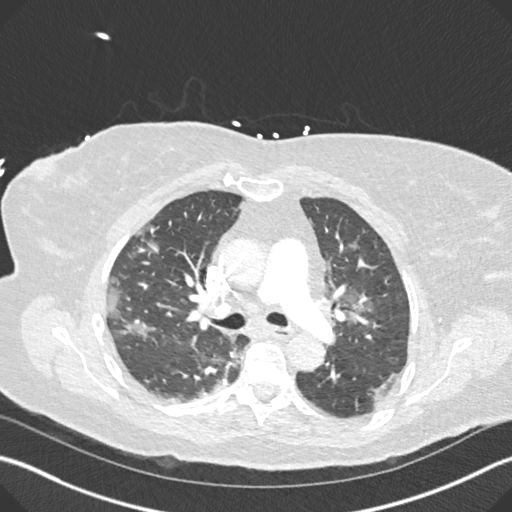
[im 225/346  mediastinal]
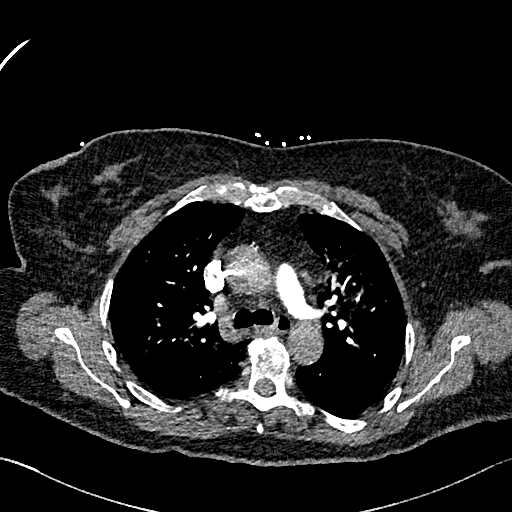
[im 242/346  lung]
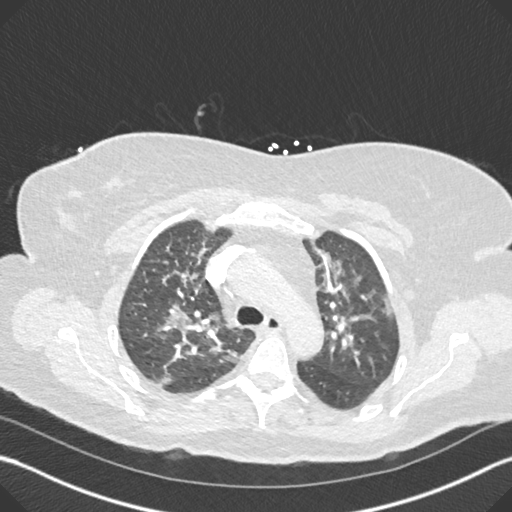
[im 277/346  mediastinal]
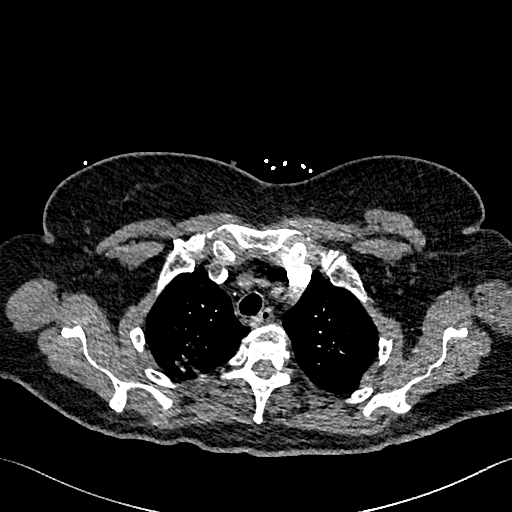
[im 294/346  lung]
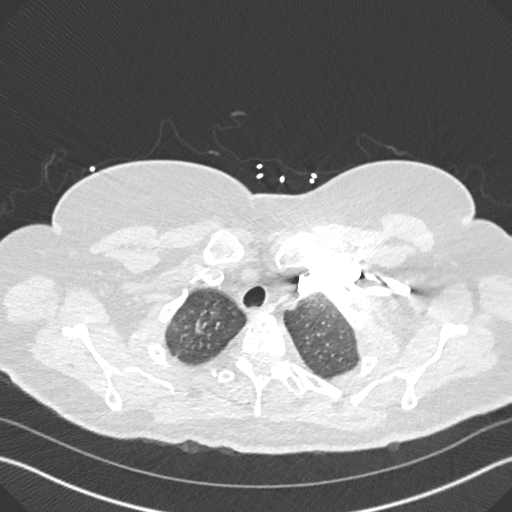
[im 311/346  mediastinal]
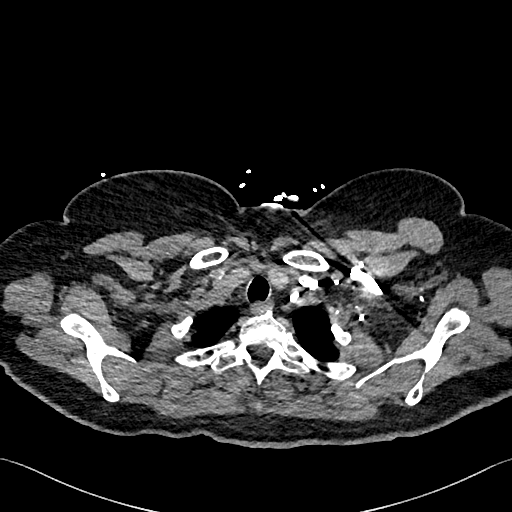
[im 328/346  lung]
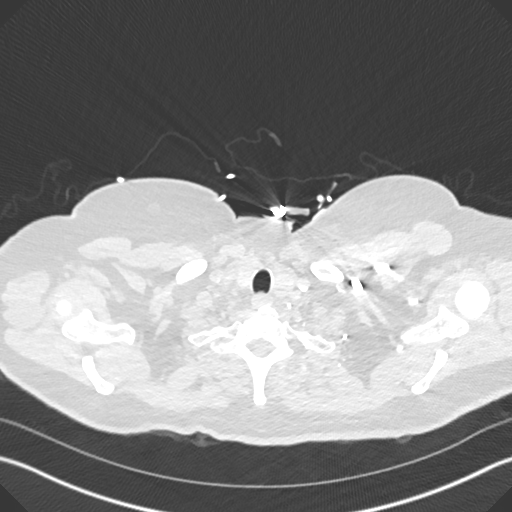

[Series 8: pe 2mm cor · coronal · 0.53mm/px · 1 of 151 slices shown]
[im 76/151  mediastinal]
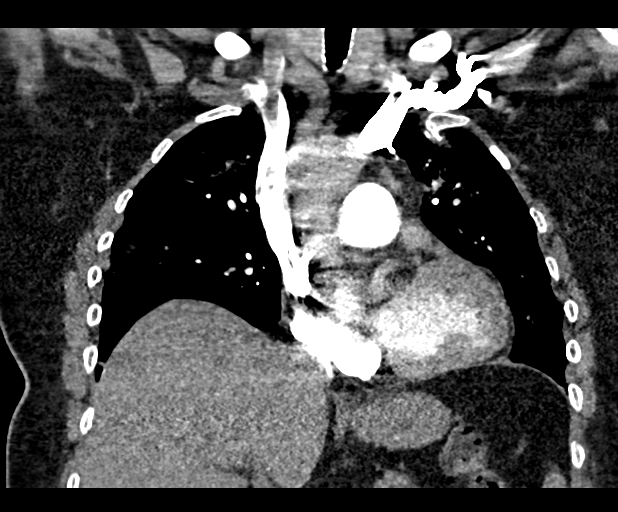

[18 of 36 positions shown; findings below may reference images not displayed]

FINDINGS: Cardiovascular: There is no cardiomegaly or pericardial effusion.
The thoracic aorta is unremarkable. There is no CT evidence of
pulmonary embolism.

Mediastinum/Nodes: Mild bilateral hilar adenopathy. The esophagus is
grossly unremarkable. No mediastinal fluid collection.

Lungs/Pleura: Bilateral peripheral and subpleural streaky and patchy
ground-glass opacities consistent with multifocal pneumonia and in
keeping with FQMJT-MQ. Clinical correlation and follow-up to
resolution recommended. There is no pleural effusion or
pneumothorax. The central airways are patent.

Upper Abdomen: Layering gallstones. A 12 mm hypodense lesion in the
right lobe of liver (116/5) is not characterized.

Musculoskeletal: No chest wall abnormality. No acute or significant
osseous findings.

Review of the MIP images confirms the above findings.
IMPRESSION: 1. No CT evidence of pulmonary embolism.
2. Multifocal pneumonia and in keeping with FQMJT-MQ. Clinical
correlation and follow-up to resolution recommended.
3. Mild bilateral hilar adenopathy, likely reactive.
4. Cholelithiasis.
5. A 12 mm indeterminate hypodense lesion in the right lobe of
liver. This can be better evaluated with MRI without and with
contrast on a nonemergent/outpatient basis.

## 2023-11-20 ENCOUNTER — Other Ambulatory Visit: Payer: Self-pay | Admitting: Internal Medicine

## 2023-11-20 NOTE — Progress Notes (Signed)
 A user error has taken place.
